# Patient Record
Sex: Female | Born: 1960 | Race: Black or African American | Hispanic: No | Marital: Married | Smoking: Current every day smoker
Health system: Southern US, Community
[De-identification: ages and names within clinical notes are randomized; demographics above are authoritative.]

---

## 2007-04-22 ENCOUNTER — Inpatient Hospital Stay (HOSPITAL_COMMUNITY): Admission: EM | Admit: 2007-04-22 | Discharge: 2007-04-27 | Payer: Self-pay | Admitting: Emergency Medicine

## 2007-04-26 ENCOUNTER — Ambulatory Visit: Payer: Self-pay | Admitting: Physical Medicine & Rehabilitation

## 2009-05-05 IMAGING — US US ABDOMEN COMPLETE
1 series · 14 of 25 positions shown · non-contrast
Comparison: None

CLINICAL DATA: Abdominal pain.  Acute pancreatitis.

ABDOMEN ULTRASOUND
TECHNIQUE: Complete abdominal ultrasound examination was performed
including evaluation of the liver, gallbladder, bile ducts,
pancreas, kidneys, spleen, IVC, and abdominal aorta.

[Series 1: unknown · 0.33mm/px · 14 of 58 slices shown]
[im 1/58]
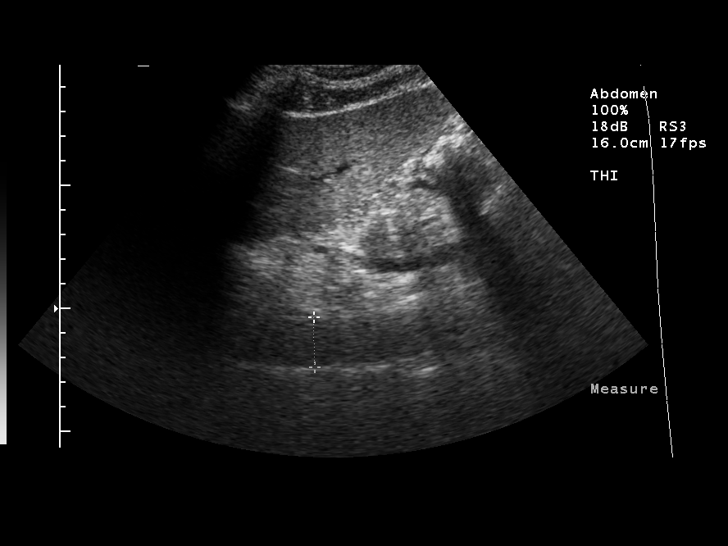
[im 5/58]
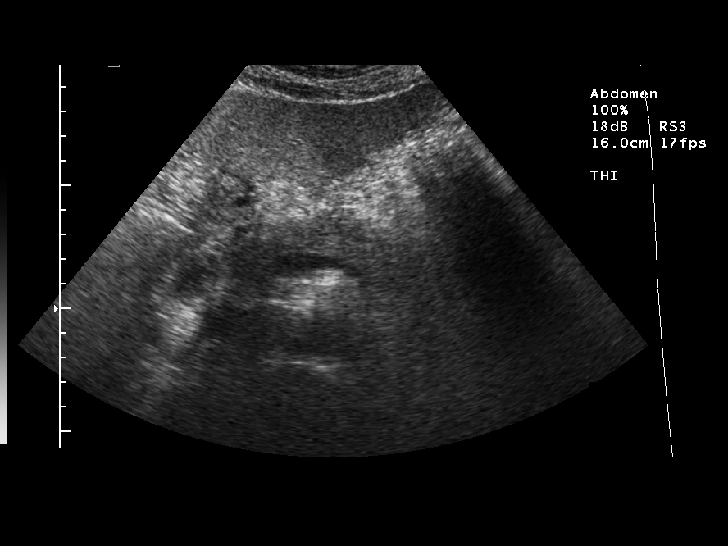
[im 10/58]
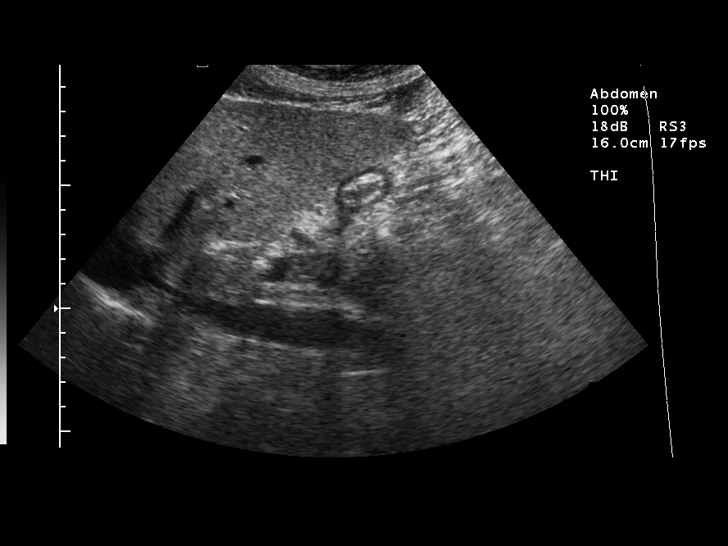
[im 15/58]
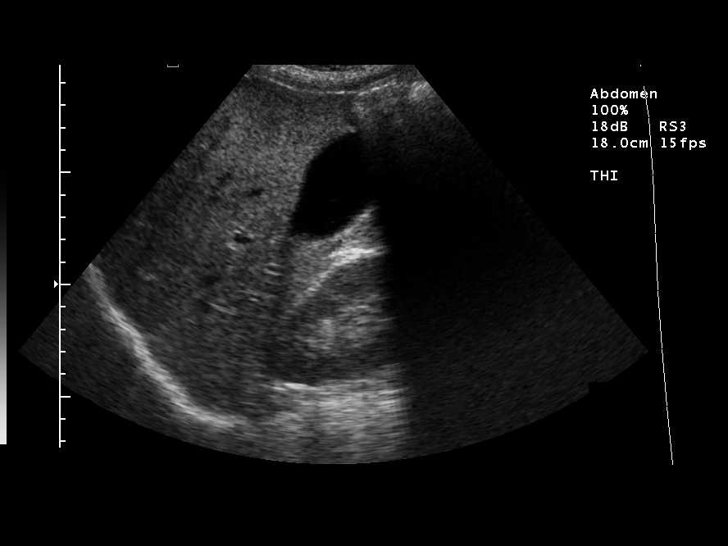
[im 20/58]
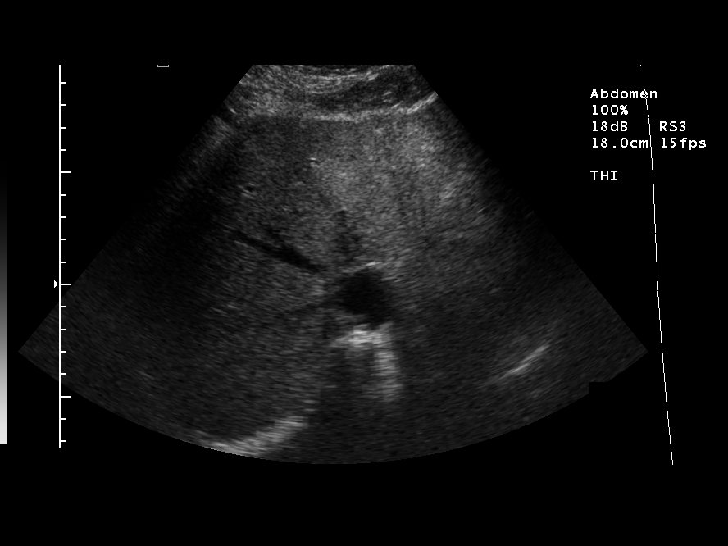
[im 22/58]
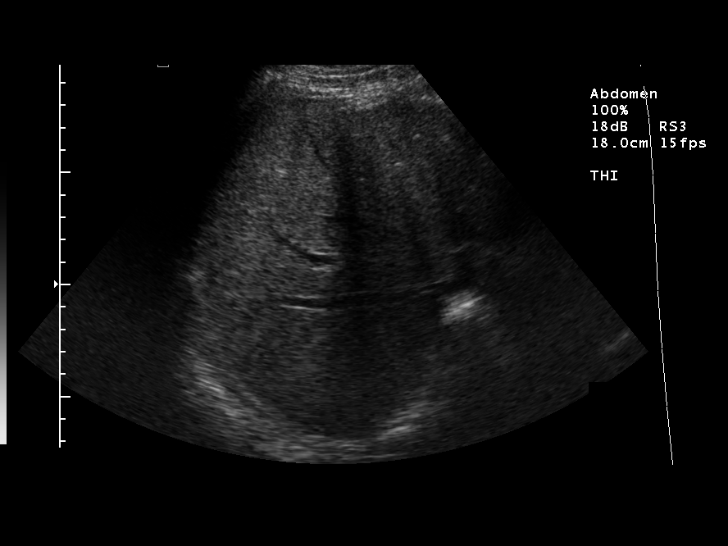
[im 27/58]
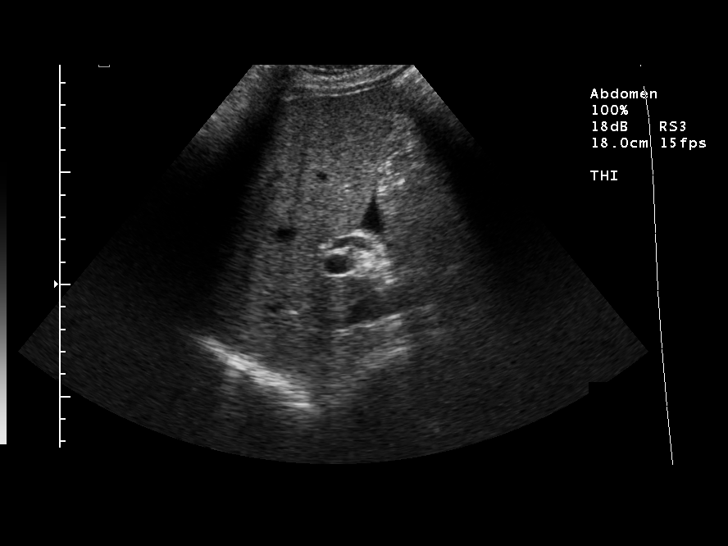
[im 31/58]
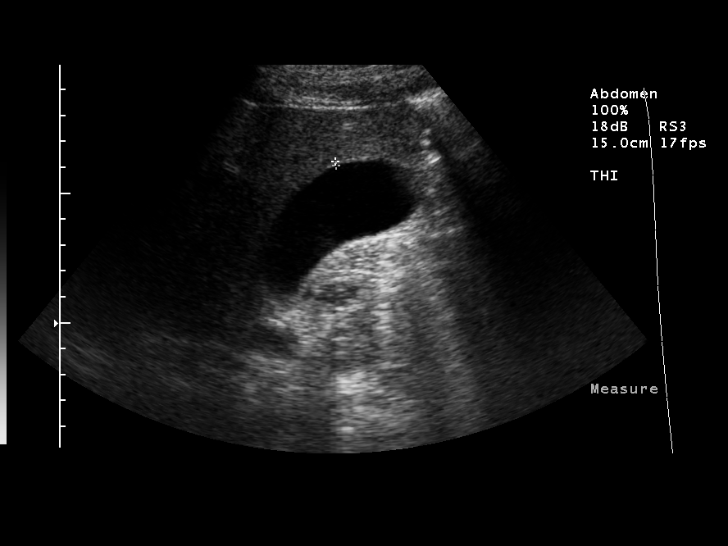
[im 36/58]
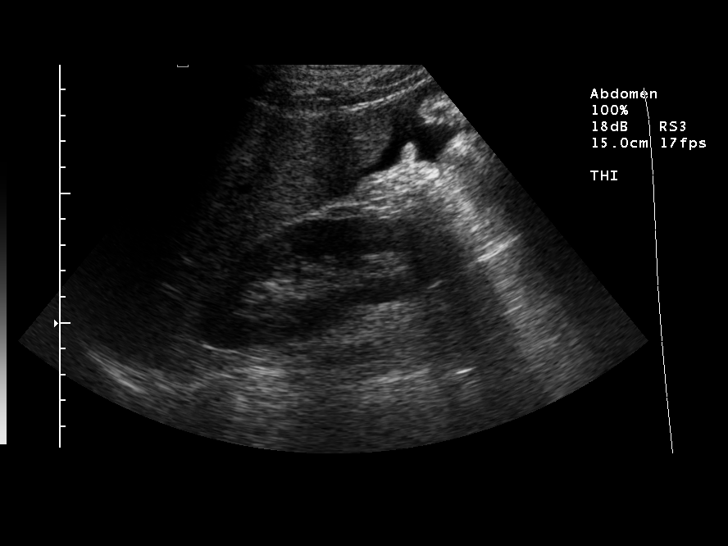
[im 39/58]
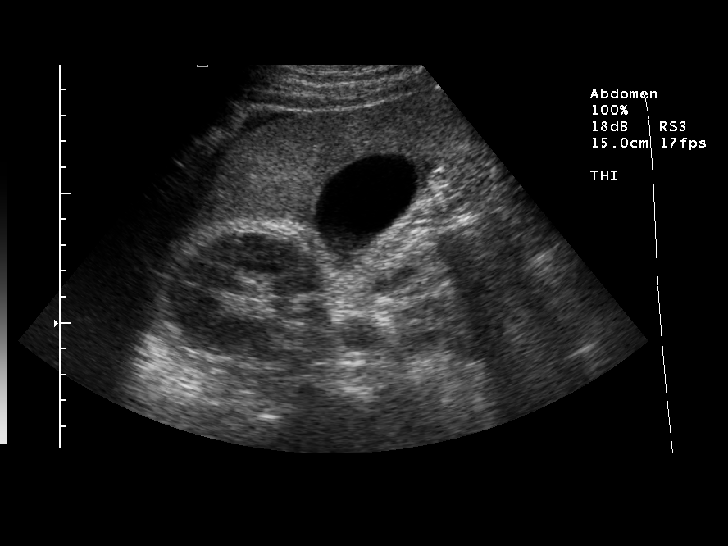
[im 43/58]
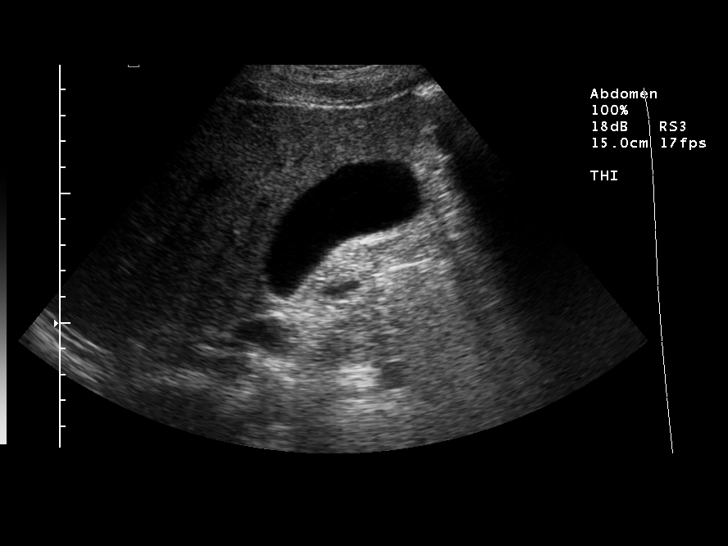
[im 48/58]
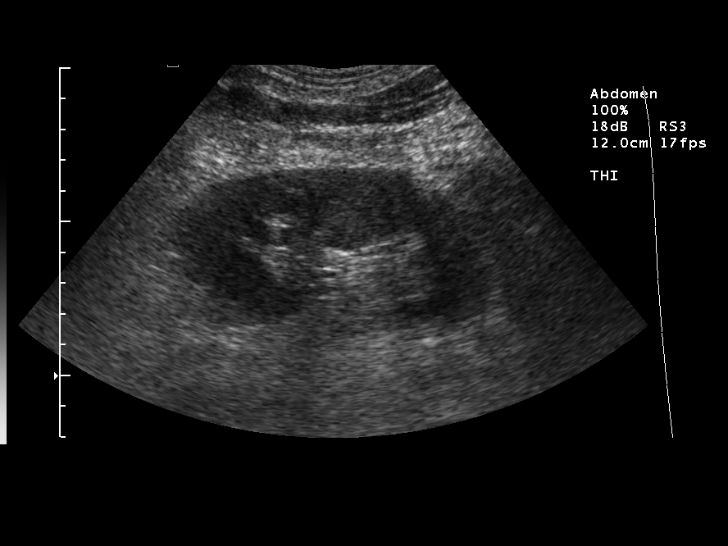
[im 53/58]
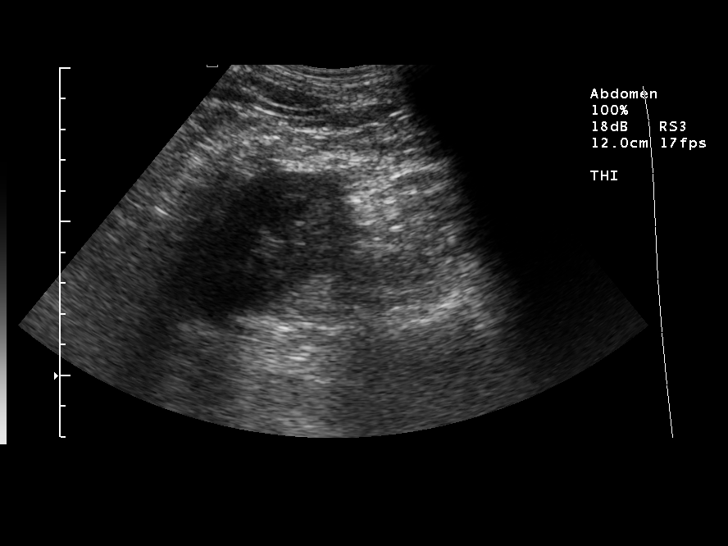
[im 58/58]
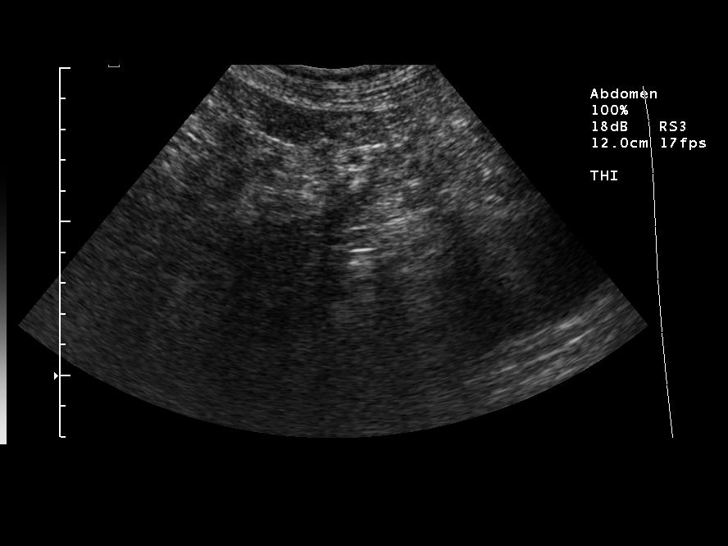

[14 of 25 positions shown; findings below may reference images not displayed]

FINDINGS: Normal gallbladder.  Negative Murphy's sign.  Gallbladder
wall thickness is 1.6 mm.  The common duct measures 5.5 mm which is
normal. The pancreas appears  hypoechoic which may be a result of
pancreatitis.  No hepatic , splenic, or renal abnormality.  The
spleen measures 6.9 cm in length.  The right and left kidneys
measure 10.8 cm and 10.6 cm in length, respectively.  Patent IVC.
Proximal abdominal aortic maximum diameter is 2.0 cm.  The mid to
distal abdominal aorta is not visualized.  Ascites is noted.
IMPRESSION: Normal gallbladder.  No biliary duct dilatation.  Ultrasonic
findings compatible acute pancreatitis. Negative for pseudocyst.
Ascites.

## 2009-05-06 IMAGING — CR DG CHEST 1V PORT
1 series · 1 of 1 positions shown · non-contrast
Comparison: None.

CLINICAL DATA: Pancreatitis.

PORTABLE CHEST - 1 VIEW

[view not recorded]
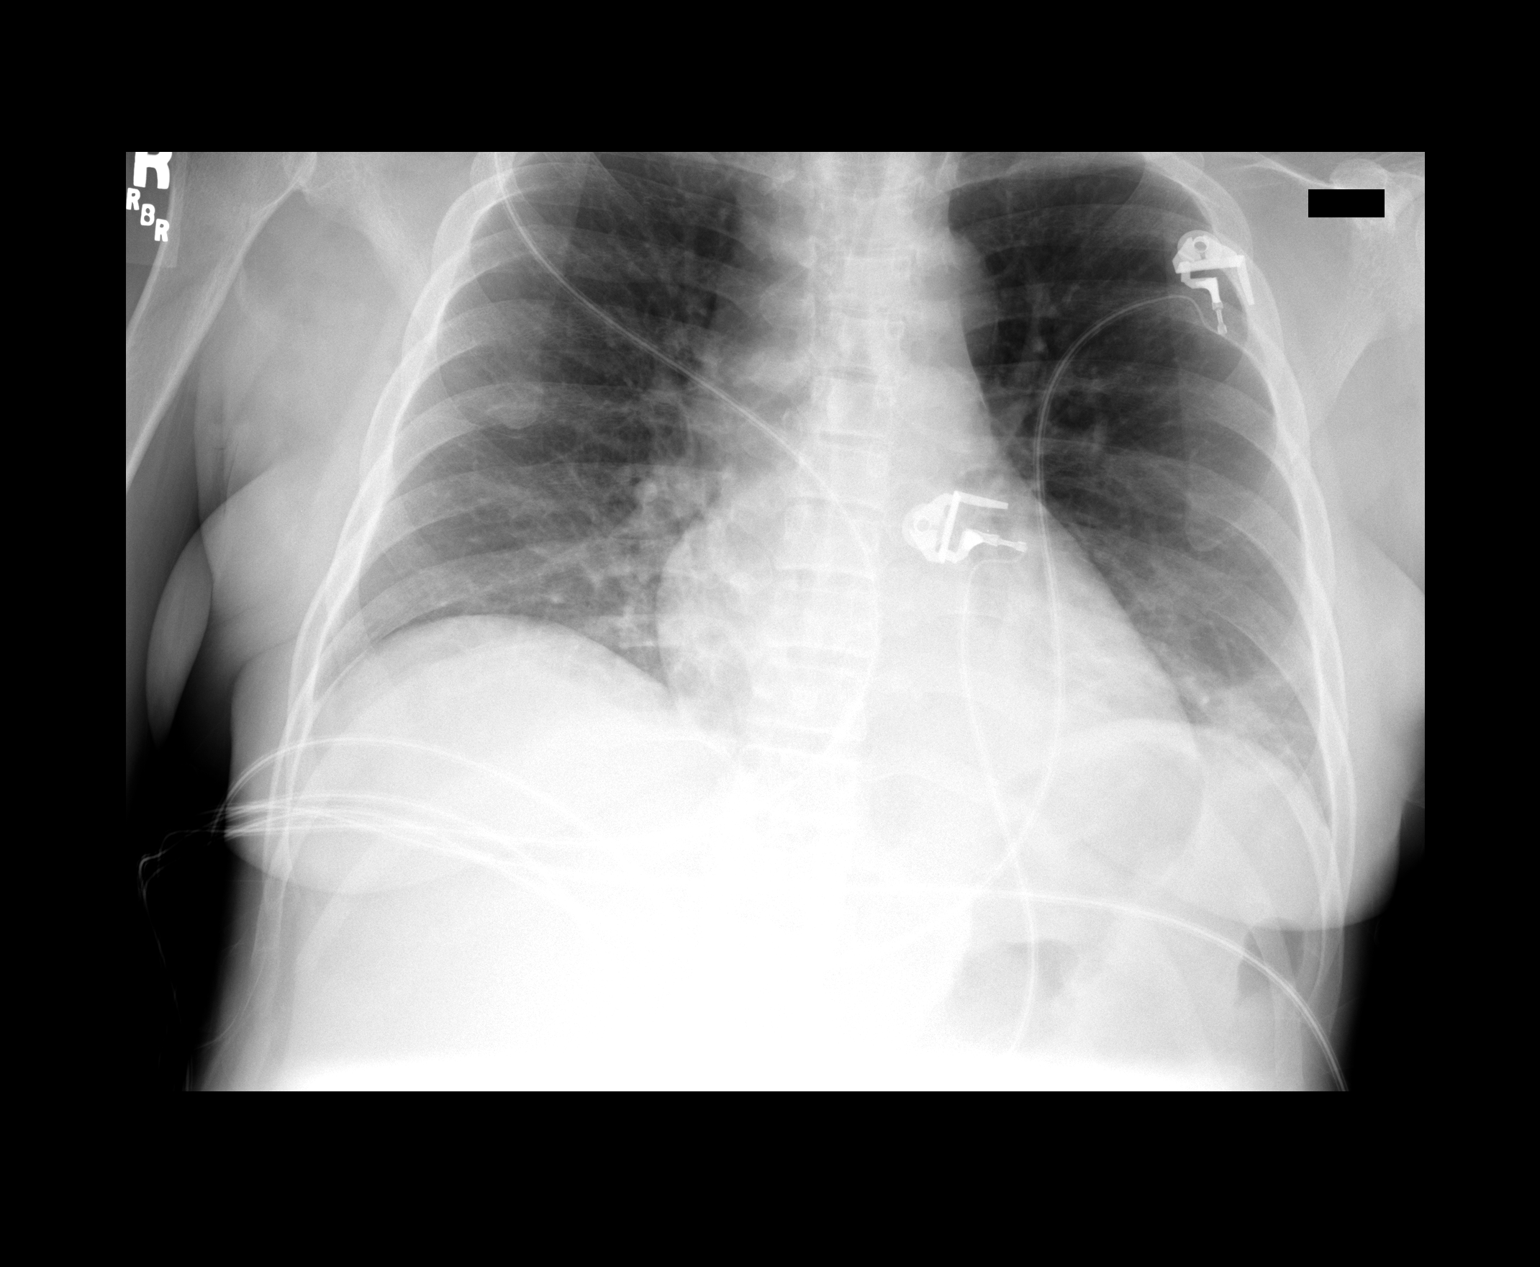

[1 of 1 positions shown; findings below may reference images not displayed]

FINDINGS: There is some mild left basilar atelectasis.  Lungs
otherwise clear.  No effusion.  Heart size normal.
IMPRESSION: Left basilar atelectasis.  No definite evidence of pneumonia.

## 2009-05-07 IMAGING — CT CT PELVIS W/ CM
2 of 5 series · 13 of 32 positions shown, 18 images · IV contrast (OMNI 300/WATER & 100 ML OMNI 300)
Comparison: None

CT ABDOMEN

CLINICAL DATA: Pancreatitis

CT ABDOMEN AND PELVIS WITH CONTRAST
TECHNIQUE: Multidetector CT imaging of the abdomen and pelvis was
performed using the standard protocol following bolus
administration of intravenous contrast.
Contrast: 100 ml Dmnipaque-JTT

[Series 2: routine abdomen · axial · 0.74mm/px · z∈[-359,-54]mm · 8 of 79 slices shown, 13 images]
[im 9/79  soft-tissue]
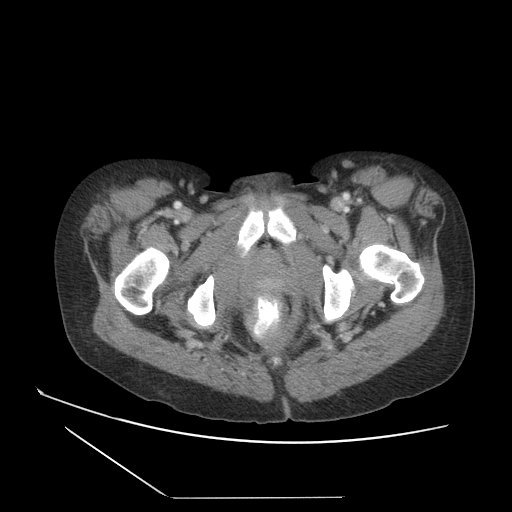
[im 9/79  bone]
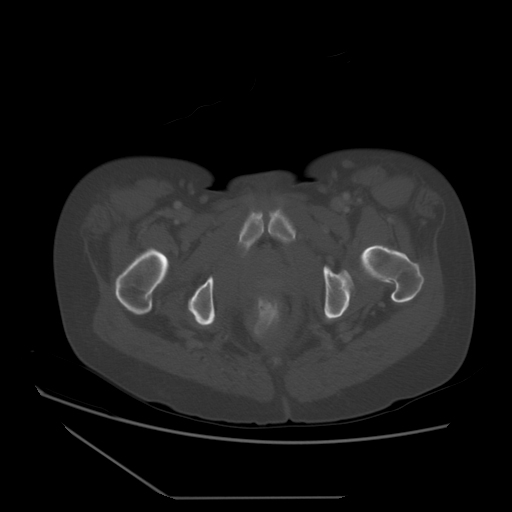
[im 18/79  soft-tissue]
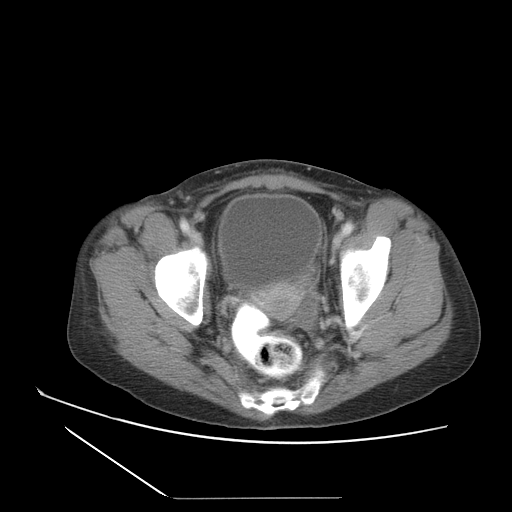
[im 27/79  soft-tissue]
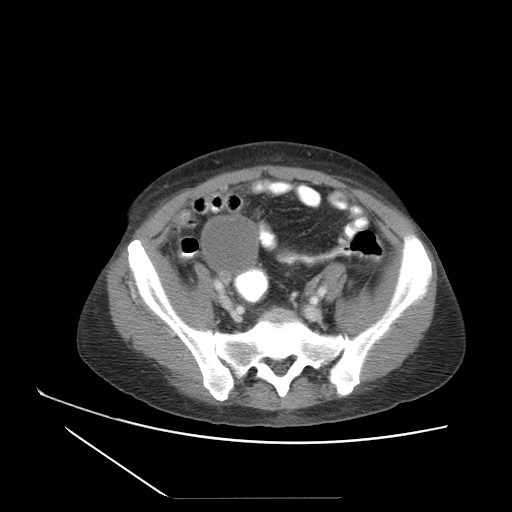
[im 35/79  soft-tissue]
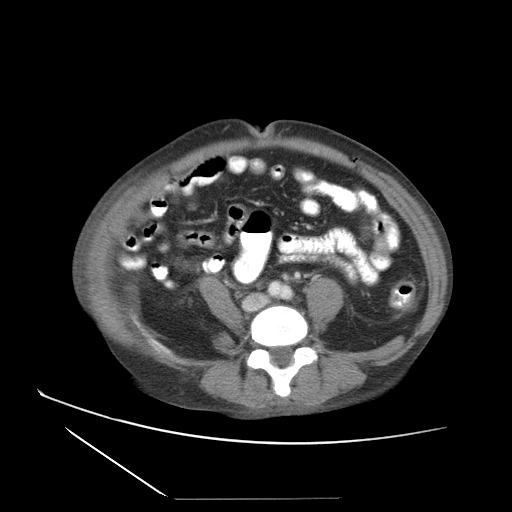
[im 44/79  soft-tissue]
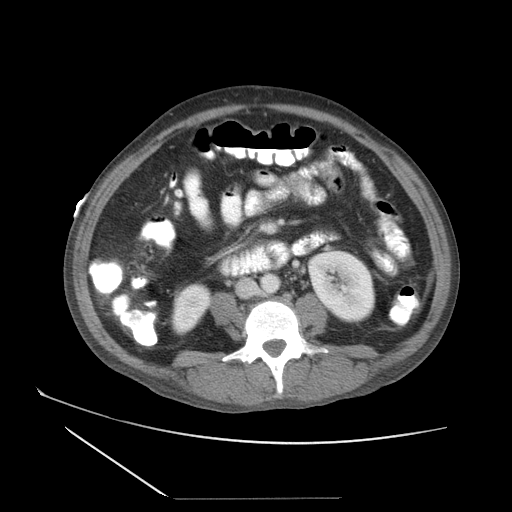
[im 44/79  lung]
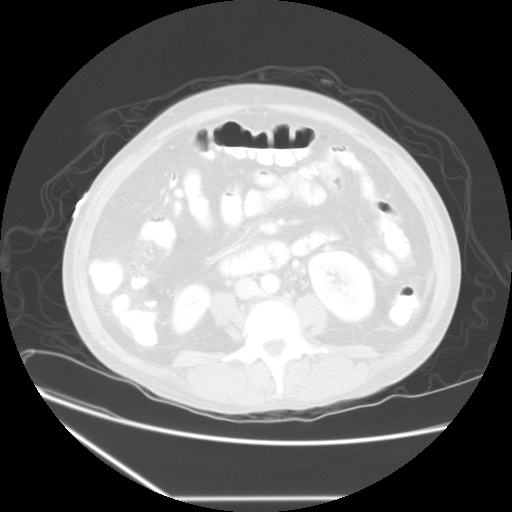
[im 53/79  soft-tissue]
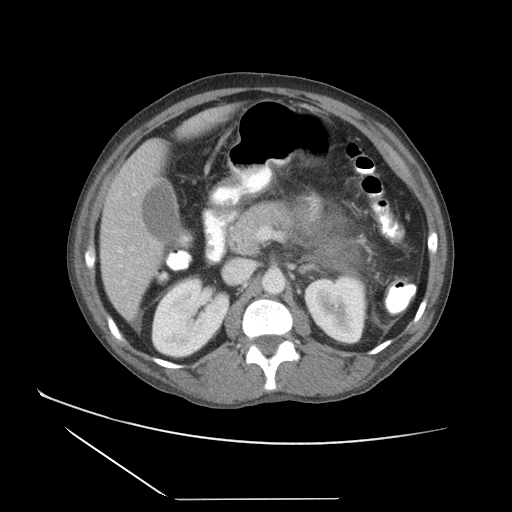
[im 53/79  lung]
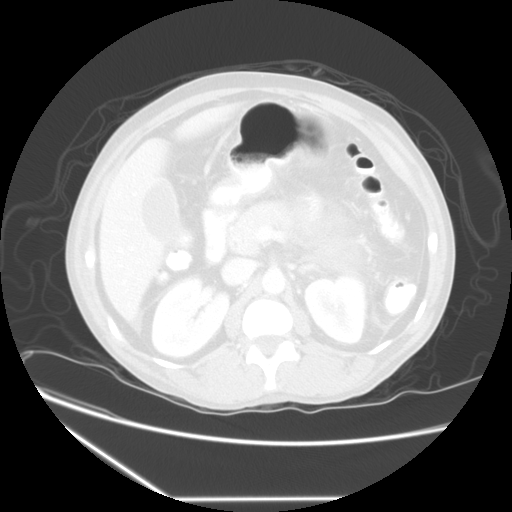
[im 61/79  soft-tissue]
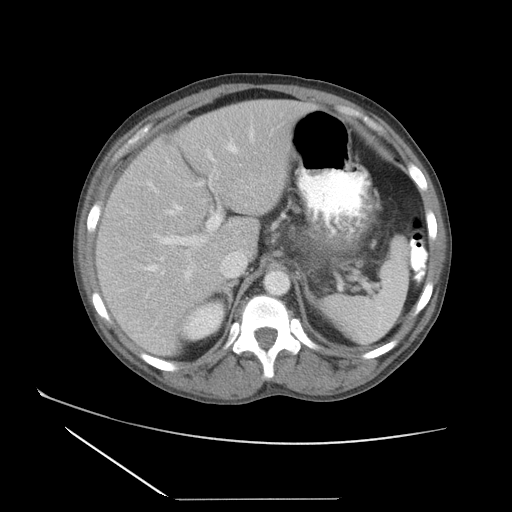
[im 61/79  lung]
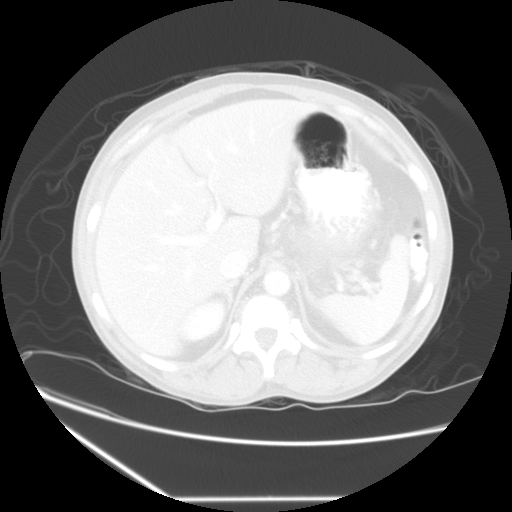
[im 70/79  soft-tissue]
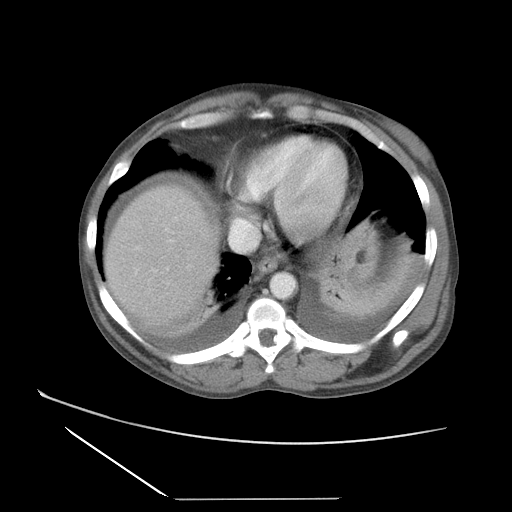
[im 70/79  lung]
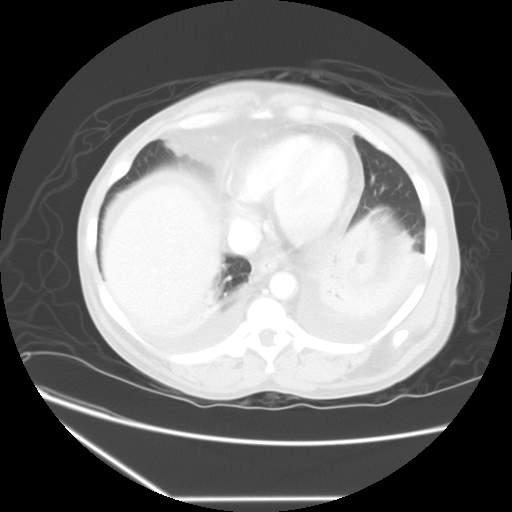

[Series 402: sag a/p · sagittal · 0.78mm/px · 5 of 78 slices shown]
[im 10/78  soft-tissue]
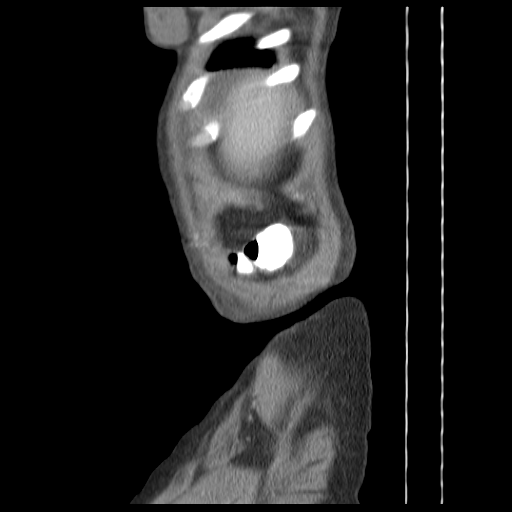
[im 20/78  soft-tissue]
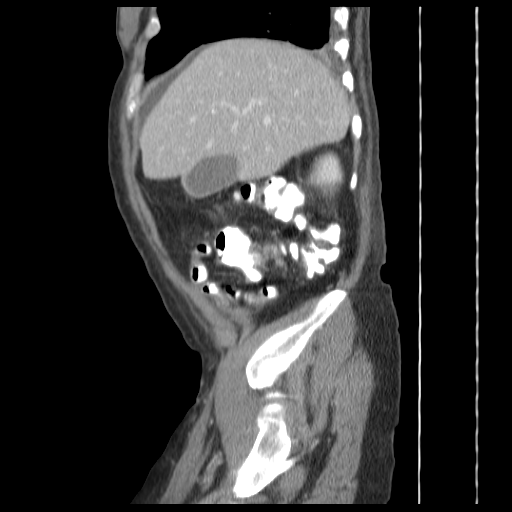
[im 29/78  soft-tissue]
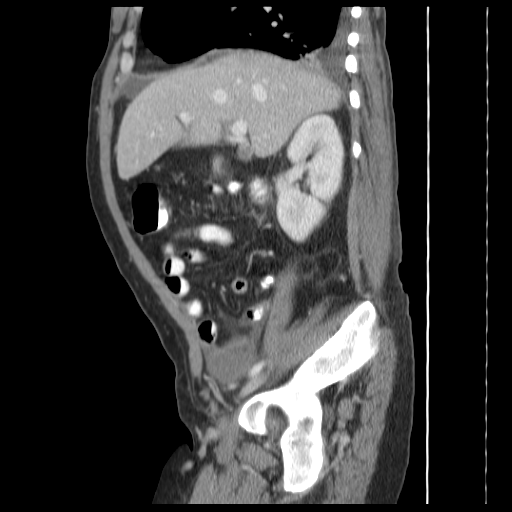
[im 39/78  soft-tissue]
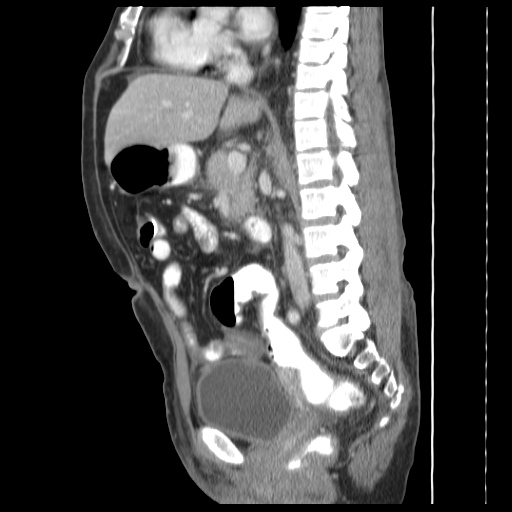
[im 49/78  soft-tissue]
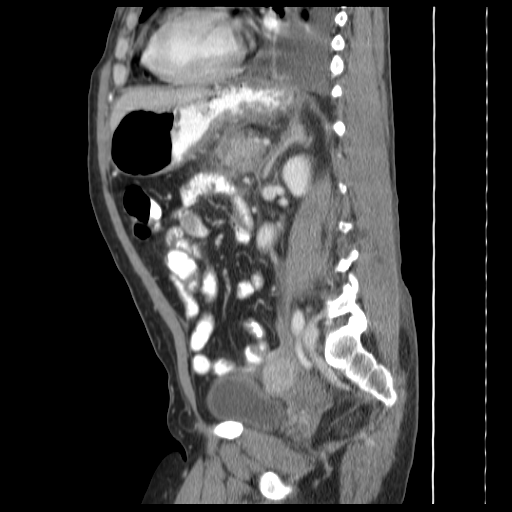

[13 of 32 positions shown; findings below may reference images not displayed]

FINDINGS: Small bilateral effusions and mild bibasilar atelectasis
is present.

Inflammatory change involving predominately the tail and body of
the pancreas is present.  There is stranding about this portion of
the pancreas in the adjacent retroperitoneal fat.  There is also
stranding and fluid density makes with soft tissue density region.
Portions of the pancreas in this area fails to enhance compatible
with minimal pancreatic necrosis.  I would estimate this to effect
approximately 10-15% of the entire gland.  There is no loculated
fluid collection containing gas to suggest abscess development.
There is no pancreatic hemorrhage.  No gallstones are seen.  No
pancreatic calcifications or ductal dilatation are present to
suggest chronic pancreatic changes.

The small amount of free fluid is seen about the liver and spleen.
The gallbladder, kidneys, adrenal glands are within normal limits.
IMPRESSION: Acute pancreatitis.  Minimal acute fluid collections involve the
tail and body.

Estimated to 10-15% pancreatic parenchymal necrosis.

No definite evidence of abscess.

Small amount of free fluid in the abdomen associated with the
inflammatory process.

Small bilateral pleural effusions.1

CT PELVIS
FINDINGS: A 4.1 x 5.6 by 5.6 cm cyst is present in the right
adnexa with a single peripheral calcification.  This is intimately
associated with the right ovary and is most likely a cystic lesion
of the right ovary.  There is a 2.6 cm lobulated mass attached to
the fundus of the uterus and left adnexa.  The left ovary is just
deep and inferior to this and is unremarkable. Trace free fluid is
seen in the pelvis.  Negative abnormal adenopathy.  The appendix is
normal.
IMPRESSION:

## 2010-06-11 NOTE — Discharge Summary (Signed)
NAMESAMARAH, Reed           ACCOUNT NO.:  [id number]   MEDICAL RECORD NO.:  [id number]          PATIENT TYPE:  INP   LOCATION:  5504                         FACILITY:  [facility]   PHYSICIAN:  Michaelyn Barter, M.D. DATE OF BIRTH:  [birth year]   DATE OF ADMISSION:  04/22/2007  DATE OF DISCHARGE:  04/27/2007                               DISCHARGE SUMMARY   PRIMARY CARE PHYSICIAN:  Unassigned.   FINAL DIAGNOSES:  1. Acute pancreatitis.  2. Pancreatic necrosis.  3. History of alcohol abuse.  4. Leukocytosis.  5. Mildly elevated bilirubin.  6. Mildly elevated triglycerides.   CONSULTATIONS:  General Surgery.   PROCEDURES:  1. Ultrasound of the abdomen completed on April 23, 2007.  2. Portable chest x-ray completed on April 24, 2007.  3. CT scan of the abdomen and pelvis completed on April 25, 2007.   HISTORY OF PRESENT ILLNESS:  [name] is a 46-year African American  female, who arrived with a chief complaint of abdominal pain,  accompanied by nausea and vomiting.   PAST MEDICAL HISTORY:  Please see that dictated by Dr. Marthann Schiller.   HOSPITAL COURSE:  1. Acute pancreatitis.  The patient's lipase was noted to be 854 at      that time of admission.  She was admitted to the medicine floor and      kept n.p.o.  She was provided with p.r.n. antiemetics.  An      ultrasound was completed of her abdomen, which revealed a normal      gallbladder.  No biliary duct dilatation.  Findings were compatible      with acute pancreatitis, negative for pseudocyst.  Ascites.  The      patient did openly admit to have an history of alcohol abuse and      this may have been associated with her acute pancreatitis.  The      patient's pain was quickly controlled with p.r.n. pain medications.      She, however, did have an impressive leukocytosis during her      hospital course.  At the time of admission, her white blood cell      count was 17.6; however, it began to climb up to a  high of 34.1.      IV Primaxin was started empirically.  A CT scan of the patient's      abdomen and pelvis was completed on March 29.  The CT scan of the      abdomen revealed acute pancreatitis.  Minimal acute fluid      collections involved the tail and body.  There was a 10%-15%      pancreatic parenchymal necrosis noted.  No definite evidence for      abscess.  By the 29, the patient's pain had almost completely      resolved.  General surgery was consulted.  Their note indicates      that at this time, the patient did not appear to be septic and her      pancreatitis appeared to be resolving.  There was no surgical      intervention  needed.  They simply recommended that IV antibiotics      continue for now.  By the date of discharge, the patient indicated      that her abdominal pain had completely resolved.  Her leukocytosis      had improved significantly.  The patient's lipase, last collected      on March 28, had declined to normal with a lipase of 55.  The      patient's overall clinical picture appeared to have improved.  2. History of alcohol abuse.  The patient was placed on Ativan IV      during the course of this hospitalization.  She did not demonstrate      any obvious active alcohol withdrawal.  3. Mildly elevated triglycerides.  A fasting lipid profile was done on      March 30.  The patient's LDL was 38, triglyceride level was noted      to be 168.  Consideration was made to starting Lipitor versus      TriCor; however, at this time neither was started.  These      parameters may need to be reviewed in the future an at that      particular time triglyceride-lowering agent may need to be started.   CONDITION AT THE TIME OF DISCHARGE:  By the date of discharge, the  patient had no abdominal pain.  She requested to be discharged home.  Her vitals on the date of discharge, her temperature was 98.7, heart  rate 79, respirations 19, blood pressure 155/80, and O2 sat 92% on  room  air.  She was discharged home on Creon 1 capsule p.o. t.i.d. as well as  Protonix 40 mg p.o. daily.  She was told to follow up with her primary  care doctor within 1-2 weeks and to stop drinking alcohol.      Michaelyn Barter, M.D.  Electronically Signed     OR/MEDQ  D:  04/27/2007  T:  04/28/2007  Job:  [id number]

## 2010-06-11 NOTE — Consult Note (Signed)
Alicia Reed, Alicia Reed           ACCOUNT NO.:  [id number]   MEDICAL RECORD NO.:  [id number]          PATIENT TYPE:  INP   LOCATION:  5504                         FACILITY:  [facility]   PHYSICIAN:  Alfonse Ras, MD   DATE OF BIRTH:  Jan 28, 1961   DATE OF CONSULTATION:  04/26/2007  DATE OF DISCHARGE:                                 CONSULTATION   TIME OF CONSULTATION:  1400 hours.   CONSULTING SURGEON:  Dr. Colin Benton.   REQUESTING PHYSICIAN:  Dr. Roxan Hockey with [facility].     The patient does not have a primary care physician.   REASON FOR CONSULTATION:  Pancreatic necrosis.   HISTORY OF PRESENT ILLNESS:  This is a 50 year old black female with a  history of health, alcohol abuse.  She presented to the ER on March 26  with a 1-day history of abdominal pain in the epigastric region and  emesis.  The patient states that her emesis is nonbilious and non  bloody.  She also says that she has not had any episodes of diarrhea or  constipation.  She also has noticed some associated back pain.  She is  also having trouble keeping any food down and every time she eats she  has these bouts of emesis.  She states the last time she drank was on  Tuesday prior to admission.  On admission the patient's white blood cell  count was 17,600 and is now down to 14,800.  The patient was started on  Primaxin several days ago.  Lipase on admission was 854 and on March 28  it has normalized to 55.  Her amylase is 115.  On admission the  patient's total bilirubin was slightly elevated at 1.3 and has been  stable since then.  Also after admission the patient had an ultrasound  of the abdomen which was basically normal except showing findings  consistent with acute pancreatitis.  After this the patient and have a  CT scan which showed again acute pancreatitis that was 10-15% pancreatic  parenchymal necrosis.  Because of this finding we were consulted for  possible surgical intervention.   REVIEW OF  SYSTEMS:  See HPI.  Otherwise all other systems are negative.   FAMILY HISTORY:  Noncontributory.   PAST MEDICAL HISTORY:  There is none.   PAST SURGICAL HISTORY:  There is none.   SOCIAL HISTORY:  The patient lives with her husband and states to me  that she only drinks about three beers a day.  However, in the history  and physical, apparently she stated that she drank approximately six  beers a day.  The patient also states that she smokes anywhere from one  to three cigarettes a day.   ALLERGIES:  PENICILLIN which gives her rash.   MEDICATIONS:  She is on no home medications.   PHYSICAL EXAM:  GENERAL:  This is a pleasant 50 year old black female  who is lying in bed in no acute distress.  VITAL SIGNS:  Temperature max is 99.4, pulse 83, respirations 20, blood  pressure 161/80.  EYES:  Sclerae noninjected.  Pupils equal, round and reactive to  light.  EARS, NOSE, MOUTH AND THROAT:  Ears and nose showed no obvious lesions  or masses.  No rhinorrhea.  Mouth is pink and moist.  Throat shows no  exudate.  Neck is supple.  Trachea is midline.  No thyromegaly.  Lungs were clear to auscultation bilaterally with no wheezes, rhonchi or  rales noted.  Respiratory effort is normal.  HEART:  Regular rate and rhythm.  Normal S1-S2 with no murmurs, gallops  or rubs noted.  +2 bilateral carotid and pedal pulses.  Chest is symmetrical.  Abdomen is soft nontender.  However, is distended and somewhat  tympanitic.  The patient does have active bowel sounds.  On palpation,  the patient does not show any signs of guarding.  MUSCULOSKELETAL:  All four extremities are symmetrical with no cyanosis,  clubbing or edema noted.  SKIN:  No obvious rashes, lesions or masses.  NEURO:  Cranial nerves II-XII are grossly intact.  Deep tendon reflex  exam is deferred at this time.  PSYCH:  The patient is alert and oriented x3 with appropriate affect.   LABS AND DIAGNOSTICS:  White blood cell count 14,800,  hemoglobin 9.79,  hematocrit 27.5, platelets 219,000.  Sodium 135, potassium 3.5, BUN 1,  creatinine 0.63.  LFTs on March 29 showed total bilirubin 1.4, alkaline  phosphatase 85, AST 21, ALT 13, and amylase and lipase that were drawn  on the 28th shows a lipase is 55 which has come down from 854 on  admission and amylase of 115. Diagnostics:  Abdominal ultrasound is  normal except for there is a finding consistent with acute pancreatitis.  No pseudocyst or ascites was found.  CT scan of the abdomen and pelvis  shows acute pancreatitis with an estimated 10-15% pancreatic parenchymal  necrosis.   IMPRESSION:  1. Acute pancreatitis.  2. Pancreatic parenchymal necrosis.   PLAN:  At this time we agree with the patient being treated with  Primaxin.  At this time the patient is not septic and is currently  resolving her pancreatitis.  At this time she no longer has any  abdominal pain and is tolerating clear liquids.  The patient states that  her doctor was planning on advancing her diet today as well.  Because of  these findings, at this time, there is no surgical intervention needed.  However, if the patient has become septic and febrile, then this may  become a surgical issue.  This patient has been discussed with Dr. Colin Benton  and any further recommendations will be per Dr. Colin Benton after her  evaluation of the patient.      Letha Cape, PA      Alfonse Ras, MD  Electronically Signed   KEO/MEDQ  D:  04/26/2007  T:  04/26/2007  Job:  [id number]   cc:   Dr. Roxan Hockey

## 2010-06-11 NOTE — H&P (Signed)
Alicia Reed, Alicia Reed           ACCOUNT NO.:  [id number]   MEDICAL RECORD NO.:  [id number]          PATIENT TYPE:  EMS   LOCATION:  [facility]                         FACILITY:  [facility]   PHYSICIAN:  Altha Harm, MDDATE OF BIRTH:  Mar 29, 1960   DATE OF ADMISSION:  04/22/2007  DATE OF DISCHARGE:                              HISTORY & PHYSICAL   CHIEF COMPLAINT:  Abdominal pain and vomiting x1 day.   HISTORY OF PRESENT ILLNESS:  This is a 50 year old African American  female who is an excessive beer drinker and presents to the emergency  room with abdominal pain and vomiting x1 day.  The patient states that  the pain is in the epigastric area, rates it as a 9/10, nonradiating and  aching in nature.  She said that she has associated vomiting associated  with the pain.  The patient states that the vomiting occurs with any  type of oral intake, is nonbilious, nonhematemetous, and she is unable  to count the amount of times she has vomited in the last 24 hours.  The  patient states she has never been hospitalized in the past and she has  no chronic illnesses.   PAST MEDICAL HISTORY:  As stated, is noncontributory except for tobacco  use disorder and excessive use.   FAMILY HISTORY:  The patient is unable to provide a family history for  me at this time.   SOCIAL HISTORY:  The patient lives with her husband.  She drinks  approximately six beers per day on a daily basis.  She smokes two to  three cigarettes per day for 20 years and she denies any illicit drug  use currently on in the past.  The patient states that her last drink  was on Tuesday.   ALLERGIES:  PENICILLIN WHICH CAUSES HER TO HAVE A RASH.   The patient is currently on no chronic medications.  She has no primary  care Lakelyn Straus.   REVIEW OF SYSTEMS:  Fourteen systems were reviewed.  All systems are  negative except for the HPI.   LABORATORY DATA:  Laboratory studies in the emergency room showed the  following:  White  blood cell count of 17.6, hemoglobin of 14.6,  hematocrit of 41.5, platelet count of 276, sodium of 135, potassium 3.1,  chloride 97, bicarb 28, BUN 4, creatinine 0.54, lipase of 854, bilirubin  mildly elevated at 1.3.  A urinalysis shows 3-6 WBCs; however, this is a  clean catch urine and it shows multiple squamous epithelial cells.   PHYSICAL EXAMINATION:  The patient is lying in bed, nontoxic appearing  and in no acute distress.  VITAL SIGNS:  Blood pressure 155/98, temperature 98.2, heart rate 93-  100, respiratory rate 16, pulse oximetry 99% on room air.  HEENT:  The patient is normocephalic, atraumatic.  Pupils are equally  round and reactive to light.  She is anicteric.  No conjunctivae pallor  noted.  Oropharynx is tacky.  No exudate, erythema or lesions are noted.  NECK:  Trachea is midline.  No masses, no thyromegaly, no JVD.  No  carotid bruit.  RESPIRATORY EXAM:  Patient has  a normal respiratory effort, equal  excursion bilaterally.  No wheezing or rhonchi noted.  ABDOMEN:  Abdomen is distended.  Patient has epigastric tenderness.  She  has hepatomegaly noted.  No splenomegaly noted.  She has normoactive  bowel sounds.  No guarding or rebound noted.  CARDIOVASCULAR:  She has mild tachycardia.  Normal S1, S2.  No murmurs,  rubs or gallops are noted.  PMI is nondisplaced.  No heaves or thrills  on palpation.  LYMPH NODE SURVEY:  No cervical, axillary, inguinal lymphadenopathy  noted.  MUSCULOSKELETAL:  No warmth, swelling or erythema around the joints.  No  spinal tenderness noted.  NEUROLOGICAL:  No focal neurological deficits.  No asterixis present.  Strength is 5/5 bilaterally upper and lower extremities.  Sensation is  intact to light touch, pin prick and proprioception.  Patient denies any  dysesthesias or paresthesias.  PSYCHIATRIC:  The patient is alert and oriented x3.  Good cognition.  Good recent to remote recall.   ASSESSMENT/PLAN:  This is a Patient who  presents with:  1. Acute pancreatitis.  2. Hypokalemia.  3. Leucocytosis without fever.  4. Alcohol dependence.  5. Risk of alcohol withdrawal.  6. Tobacco use disorder.  7. Precontemplative state.  8. Dehydration.   PLAN:  The patient will remain n.p.o.  She will be given aggressive IV  hydration and her potassium repleted via IV.  The patient will also be  given pain medication for her pancreatitis.  If the pain escalates, we  will go ahead and put her in a PCA.  For right now she will get p.r.n.  doses of Dilaudid.   I will also get an ultrasound of the right upper quadrant of the abdomen  and we will put the patient on a monitor and continue to monitor her  heart rate.  The patient is at risk for delirium tremens.  I will get an  alcohol level on her today and we will also go ahead and put the patient  on p.r.n. Ativan intimally.  The patient may need to go on an Ativan  protocol.  However, at this time the patient is not a candidate for  detox as she is not interested in alcohol cessation.  Thus, I would not  put the patient on a detox regimen.   Further treatment of the patient will be determined based upon initial  response to the treatment.  The patient has a leucocytosis.  However,  this may be secondary to the pain, inflammation and the dehydrated  state.  We will hydrate the patient and repeat the white blood cell  count in the a.m.  The patient has had a urinalysis performed.  However,  it was  clean catch and dirty urine, so I will repeat a cast specimen of  the UA test as for any evidence of cystitis, which may also contribute  to the leucocytosis.      Altha Harm, MD  Electronically Signed     MAM/MEDQ  D:  04/22/2007  T:  04/22/2007  Job:  [id number]

## 2010-10-21 LAB — URINE MICROSCOPIC-ADD ON

## 2010-10-21 LAB — COMPREHENSIVE METABOLIC PANEL
ALT: 8
AST: 20
Albumin: 2.3 — ABNORMAL LOW
Albumin: 2.5 — ABNORMAL LOW
Alkaline Phosphatase: 71
Alkaline Phosphatase: 85
BUN: 4 — ABNORMAL LOW
BUN: 4 — ABNORMAL LOW
CO2: 28
Calcium: 8.5
Chloride: 103
Chloride: 97
Chloride: 97
Creatinine, Ser: 0.61
Creatinine, Ser: 0.75
GFR calc Af Amer: >60
GFR calc non Af Amer: >60
Glucose, Bld: 146 — ABNORMAL HIGH
Glucose, Bld: 67 — ABNORMAL LOW
Potassium: 3.1 — ABNORMAL LOW
Sodium: 134 — ABNORMAL LOW
Total Bilirubin: 1.3 — ABNORMAL HIGH
Total Bilirubin: 1.4 — ABNORMAL HIGH
Total Bilirubin: 1.5 — ABNORMAL HIGH

## 2010-10-21 LAB — LIPID PANEL
Cholesterol: 103
HDL: 31 — ABNORMAL LOW
Total CHOL/HDL Ratio: 3.3
VLDL: 34

## 2010-10-21 LAB — BASIC METABOLIC PANEL
BUN: 3 — ABNORMAL LOW
Calcium: 8.3 — ABNORMAL LOW
Calcium: 8.6
Creatinine, Ser: 0.69
GFR calc Af Amer: >60
GFR calc non Af Amer: >60
GFR calc non Af Amer: >60
Glucose, Bld: 72
Glucose, Bld: 88
Potassium: 3.5
Potassium: 3.5
Sodium: 135

## 2010-10-21 LAB — URINALYSIS, ROUTINE W REFLEX MICROSCOPIC
Glucose, UA: 100 — AB
Ketones, ur: 40 — AB
Protein, ur: >300 — AB
Protein, ur: >300 — AB
Urobilinogen, UA: 1
pH: 6

## 2010-10-21 LAB — URINE CULTURE: Colony Count: NO GROWTH

## 2010-10-21 LAB — CBC
HCT: 27.1 — ABNORMAL LOW
HCT: 27.5 — ABNORMAL LOW
HCT: 30.8 — ABNORMAL LOW
HCT: 34.6 — ABNORMAL LOW
HCT: 41.5
Hemoglobin: 10.8 — ABNORMAL LOW
Hemoglobin: 14.6
Hemoglobin: 9.5 — ABNORMAL LOW
Hemoglobin: 9.7 — ABNORMAL LOW
MCHC: 35
MCV: 97.7
MCV: 97.8
MCV: 98.5
Platelets: 192
Platelets: 197
Platelets: 241
Platelets: 251
RBC: 2.77 — ABNORMAL LOW
RBC: 2.8 — ABNORMAL LOW
RBC: 3.27 — ABNORMAL LOW
RBC: 4.23
RDW: 12.6
RDW: 12.7
RDW: 12.8
RDW: 12.9
RDW: 13.2
WBC: 12.1 — ABNORMAL HIGH
WBC: 14.8 — ABNORMAL HIGH
WBC: 24.7 — ABNORMAL HIGH
WBC: 25.2 — ABNORMAL HIGH
WBC: 34.1 — ABNORMAL HIGH

## 2010-10-21 LAB — HEPATITIS B SURFACE ANTIGEN: Hepatitis B Surface Ag: NEGATIVE

## 2010-10-21 LAB — DIFFERENTIAL
Basophils Absolute: 0
Basophils Absolute: 0
Basophils Relative: 0
Eosinophils Absolute: 0
Eosinophils Relative: 0
Lymphocytes Relative: 4 — ABNORMAL LOW
Lymphs Abs: 1
Monocytes Relative: 6
Neutro Abs: 15.9 — ABNORMAL HIGH
Neutrophils Relative %: 90 — ABNORMAL HIGH
Neutrophils Relative %: 90 — ABNORMAL HIGH

## 2010-10-21 LAB — ETHANOL: Alcohol, Ethyl (B): <5

## 2010-10-21 LAB — CMV IGM: CMV IgM: <0.9 Index (ref <0.90)

## 2010-10-21 LAB — PHOSPHORUS: Phosphorus: 3.6

## 2010-10-21 LAB — LIPASE, BLOOD: Lipase: 328 — ABNORMAL HIGH

## 2010-10-21 LAB — MAGNESIUM: Magnesium: 2.1

## 2010-10-21 LAB — HEPATITIS A ANTIBODY, IGM: Hep A IgM: NEGATIVE

## 2013-08-23 ENCOUNTER — Encounter (HOSPITAL_COMMUNITY): Payer: Self-pay | Admitting: Emergency Medicine

## 2013-08-23 ENCOUNTER — Emergency Department (HOSPITAL_COMMUNITY)
Admission: EM | Admit: 2013-08-23 | Discharge: 2013-08-23 | Disposition: A | Discharge status: Home / Self Care | Payer: Self-pay | Attending: Emergency Medicine | Admitting: Emergency Medicine

## 2013-08-23 ENCOUNTER — Emergency Department (HOSPITAL_COMMUNITY): Payer: Self-pay

## 2013-08-23 DIAGNOSIS — F17200 Nicotine dependence, unspecified, uncomplicated: Secondary | ICD-10-CM | POA: Insufficient documentation

## 2013-08-23 DIAGNOSIS — R634 Abnormal weight loss: Secondary | ICD-10-CM | POA: Insufficient documentation

## 2013-08-23 DIAGNOSIS — R945 Abnormal results of liver function studies: Secondary | ICD-10-CM

## 2013-08-23 DIAGNOSIS — R7989 Other specified abnormal findings of blood chemistry: Secondary | ICD-10-CM | POA: Insufficient documentation

## 2013-08-23 DIAGNOSIS — Z79899 Other long term (current) drug therapy: Secondary | ICD-10-CM | POA: Insufficient documentation

## 2013-08-23 DIAGNOSIS — E876 Hypokalemia: Secondary | ICD-10-CM | POA: Insufficient documentation

## 2013-08-23 DIAGNOSIS — Z880 Allergy status to penicillin: Secondary | ICD-10-CM | POA: Insufficient documentation

## 2013-08-23 LAB — CBC WITH DIFFERENTIAL/PLATELET
BASOS ABS: 0 10*3/uL (ref 0.0–0.1)
Basophils Relative: 0 % (ref 0–1)
Eosinophils Absolute: 0 10*3/uL (ref 0.0–0.7)
Eosinophils Relative: 0 % (ref 0–5)
HEMATOCRIT: 31.2 % — AB (ref 36.0–46.0)
HEMOGLOBIN: 10.8 g/dL — AB (ref 12.0–15.0)
LYMPHS PCT: 23 % (ref 12–46)
Lymphs Abs: 2.7 10*3/uL (ref 0.7–4.0)
MCH: 33.4 pg (ref 26.0–34.0)
MCHC: 34.6 g/dL (ref 30.0–36.0)
MCV: 96.6 fL (ref 78.0–100.0)
MONO ABS: 1.1 10*3/uL — AB (ref 0.1–1.0)
MONOS PCT: 9 % (ref 3–12)
NEUTROS ABS: 7.9 10*3/uL — AB (ref 1.7–7.7)
Neutrophils Relative %: 68 % (ref 43–77)
Platelets: 339 10*3/uL (ref 150–400)
RBC: 3.23 MIL/uL — ABNORMAL LOW (ref 3.87–5.11)
RDW: 12 % (ref 11.5–15.5)
WBC: 11.7 10*3/uL — AB (ref 4.0–10.5)

## 2013-08-23 LAB — COMPREHENSIVE METABOLIC PANEL
ALK PHOS: 271 U/L — AB (ref 39–117)
ALT: 63 U/L — AB (ref 0–35)
AST: 70 U/L — AB (ref 0–37)
Albumin: 2.9 g/dL — ABNORMAL LOW (ref 3.5–5.2)
Anion gap: 16 — ABNORMAL HIGH (ref 5–15)
BILIRUBIN TOTAL: 0.5 mg/dL (ref 0.3–1.2)
BUN: 6 mg/dL (ref 6–23)
CHLORIDE: 99 meq/L (ref 96–112)
CO2: 25 meq/L (ref 19–32)
CREATININE: 0.52 mg/dL (ref 0.50–1.10)
Calcium: 8.6 mg/dL (ref 8.4–10.5)
GFR calc Af Amer: >90 mL/min (ref >90)
Glucose, Bld: 123 mg/dL — ABNORMAL HIGH (ref 70–99)
POTASSIUM: 3.2 meq/L — AB (ref 3.7–5.3)
Sodium: 140 mEq/L (ref 137–147)
Total Protein: 7.5 g/dL (ref 6.0–8.3)

## 2013-08-23 LAB — TSH: TSH: 1.25 u[IU]/mL (ref 0.350–4.500)

## 2013-08-23 LAB — LIPASE, BLOOD: LIPASE: 26 U/L (ref 11–59)

## 2013-08-23 LAB — T4, FREE: Free T4: 0.9 ng/dL (ref 0.80–1.80)

## 2013-08-23 MED ORDER — POTASSIUM CHLORIDE CRYS ER 20 MEQ PO TBCR
60.0000 meq | EXTENDED_RELEASE_TABLET | Freq: Once | ORAL | Status: AC
  Administered 2013-08-23: 60 meq via ORAL
  Filled 2013-08-23: qty 3

## 2013-08-23 NOTE — Discharge Instructions (Signed)
Failure to Thrive, Adult °Adult failure to thrive is a condition that some older people develop. People with this condition are able to do fewer and fewer activities over time. They may lose interest in being with friends or may not want to eat or drink. This is not a normal part of aging. Many things can cause this. Health problems, long-term disease, depression, bad eating habits, cognitive impairment, or disability may play a role in the development of this condition. Most of the time, it is important to treat whatever is causing failure to thrive. Sometimes, though, it might not be possible to treat the condition. The person could be nearing the end of life. Then, treatment could make the person suffer longer. °CAUSES °Sometimes, no specific cause can be found. Factors that have been linked to failure to thrive include: °· Diseases and medical conditions, such as: °¨ Cancer. °¨ Diabetes. °¨ Stomach and intestinal (gastrointestinal) problems. °¨ Lung disease. °¨ Liver disease. °¨ Kidney disease. °¨ Heart problems. °¨ Thyroid disease. °¨ Neurologic problems. °¨ Vitamin deficiencies. °· Disability. This may be the result of: °¨ A broken hip. °¨ A stroke. °¨ Very bad arthritis. °¨ Infections that last a long time. °¨ A long recovery from a surgery. °¨ Mental health issues. °¨ Medicines. °¨ Eating problems. °· Medicine for certain conditions. These conditions include: °¨ Parkinson's disease. °¨ Seizure disorder. °¨ Anxiety. °¨ Pain. °¨ High blood pressure. °¨ Depression. °¨ Infections. °SYMPTOMS °· Losing weight (more than 5% of total body weight). °· Getting more tired than usual after an activity. °· Having trouble getting up after sitting. °· Not being hungry or thirsty. °· Not getting out of bed. °· Not wanting to do usual activities. °· Being depressed. °· Getting infections often. °· Having bedsores. °· Taking a long time to recover after an injury or a surgery. °· Weakness. °DIAGNOSIS °A physical exam can help  a caregiver decide if someone has adult failure to thrive. This may include questions about the person's health presently and in the past. It also may include questions about behavior and mood, such as: °· Has activity changed? °· Does the person seem sad? °· Are eating habits different? °The caregiver may ask for a list of all medicines taken because certain medicines can lead to this condition. The list should include prescription and over-the-counter medicines. The caregiver will likely order some tests. These may include: °· Blood tests to check for infection, certain diseases, deficiencies, hormone levels, malnutrition, or dehydration. °· Urine tests to check for urinary tract infection or kidney failure. °· Imaging tests. Examples are an X-ray, a computed tomography (CT) scan, and magnetic resonance imaging (MRI). °· Hearing tests. °· Vision tests. °· Cognitive tests to check thinking ability. °· Activity tests to see if the person can do basic tasks like bathing and dressing. There also are tests to check if someone can shop, cook, or move around safely. °The caregiver will check if the person is eating enough healthy food. This may include: °· Checking weight. °· Having blood tested for cholesterol and protein levels. °· Seeing if anything else might be making it hard to eat (tooth problems, poorly fitted dentures, trouble swallowing). °The person may need to see a specialist to help with diagnosis or treatment. These specialists may include a speech therapist, physical therapist, occupational therapist, dietitian, or social worker.  °TREATMENT °Treatment for adult failure to thrive depends on the cause. Caregivers also must decide if a treatment has a good chance of working. It   often takes a team of caregivers to find the right treatment. Options may include: °· Treatments to cure a disease that can cause adult failure to thrive. °· Talk therapy or medicine to treat depression. °· A better diet. Eating more  often, adding nutritional supplements between meals, or taking vitamins may be suggested. Sometimes, medicine is prescribed to boost appetite. °· Medicine changes or stopping a medicine. °· Physical therapy. °· Moving to a place that offers more aid. °HOME CARE INSTRUCTIONS °What needs to be done at home varies from person to person. This will depend on what caused the condition and how it is treated. However, basic guidelines include: °· Taking any medicine prescribed by the caregiver. Following the directions carefully is important. °· Eating healthy foods. There should be enough calories in each meal. Ask the caregiver if vitamins or nutritional supplements should be taken between meals. Consider talking with a dietitian. °· Exercising. Strength training is important. A physical therapist can help set up an exercise program that fits the person. °· Making sure the person is safe at home. °· Talking with caregivers about what should be done if the person can no longer make decisions for himself or herself. °SEEK MEDICAL CARE IF: °· There are any questions about medicines. °· There are questions about the effects of treatment. °· The person is not able to eat well. °· The person is not able to move around. °· The person feels very sad or hopeless. °SEEK IMMEDIATE MEDICAL CARE IF:  °· The person has thoughts of ending his or her life. °· The person cannot eat or drink. °· The person does not get out of bed. °· Staying at home is no longer safe. °· The person has a fever. °Document Released: 04/07/2011 Document Reviewed: 04/07/2011 °[employer]® Patient Information ©2015 [employer]. This information is not intended to replace advice given to you by your health care provider. Make sure you discuss any questions you have with your health care provider. ° °

## 2013-08-23 NOTE — ED Provider Notes (Signed)
CSN: [id number]     Arrival date & time 08/23/13  1502 History   First MD Initiated Contact with Patient 08/23/13 1914     Chief Complaint  Patient presents with  . Weight Loss     (Consider location/radiation/quality/duration/timing/severity/associated sxs/prior Treatment) HPI Comments: Pt presents with about 60 lbs weight loss over last several months to 1 year. She denies fevers, night sweats, bleeding, CP, SOB, abdominal pain, enlarged lymph nodes. She drinks beer daily. Denies drug use. No known medical history.   The history is provided by the patient. No language interpreter was used.    History reviewed. No pertinent past medical history. History reviewed. No pertinent past surgical history. History reviewed. No pertinent family history. History  Substance Use Topics  . Smoking status: Current Every Day Smoker    Types: Cigarettes  . Smokeless tobacco: Not on file  . Alcohol Use: Yes     Comment: occ   OB History   Grav Para Term Preterm Abortions TAB SAB Ect Mult Living                 Review of Systems  Constitutional: Positive for unexpected weight change. Negative for fever, chills, diaphoresis, activity change, appetite change and fatigue.  HENT: Negative for congestion, facial swelling, rhinorrhea and sore throat.   Eyes: Negative for photophobia and discharge.  Respiratory: Negative for cough, chest tightness and shortness of breath.   Cardiovascular: Negative for chest pain, palpitations and leg swelling.  Gastrointestinal: Negative for nausea, vomiting, abdominal pain and diarrhea.  Endocrine: Negative for polydipsia and polyuria.  Genitourinary: Negative for dysuria, frequency, difficulty urinating and pelvic pain.  Musculoskeletal: Negative for arthralgias, back pain, neck pain and neck stiffness.  Skin: Negative for color change and wound.  Allergic/Immunologic: Negative for immunocompromised state.  Neurological: Negative for facial asymmetry, weakness,  numbness and headaches.  Hematological: Does not bruise/bleed easily.  Psychiatric/Behavioral: Negative for confusion and agitation.      Allergies  Penicillins  Home Medications   Prior to Admission medications   Medication Sig Start Date End Date Taking? Authorizing Provider  omeprazole (PRILOSEC OTC) 20 MG tablet Take 20 mg by mouth daily.   Yes Historical Provider, MD   BP 143/71  Pulse 68  Temp(Src) 98.2 F (36.8 C) (Oral)  Resp 15  Ht 4\' 11"  (1.499 m)  Wt 59 lb 9.6 oz (27.034 kg)  BMI 12.03 kg/m2  SpO2 100% Physical Exam  Constitutional: She is oriented to person, place, and time. She appears well-developed and well-nourished. She appears cachectic. No distress.  HENT:  Head: Normocephalic and atraumatic.  Mouth/Throat: No oropharyngeal exudate.  Eyes: Pupils are equal, round, and reactive to light.  Neck: Normal range of motion. Neck supple.  Cardiovascular: Normal rate, regular rhythm and normal heart sounds.  Exam reveals no gallop and no friction rub.   No murmur heard. Pulmonary/Chest: Effort normal and breath sounds normal. No respiratory distress. She has no wheezes. She has no rales.  Abdominal: Soft. Bowel sounds are normal. She exhibits no distension and no mass. There is no tenderness. There is no rebound and no guarding.  Musculoskeletal: Normal range of motion. She exhibits no edema and no tenderness.  Neurological: She is alert and oriented to person, place, and time.  Skin: Skin is warm and dry.  Psychiatric: She has a normal mood and affect.    ED Course  Procedures (including critical care time) Labs Review Labs Reviewed  CBC WITH DIFFERENTIAL - Abnormal; Notable for  the following:    WBC 11.7 (*)    RBC 3.23 (*)    Hemoglobin 10.8 (*)    HCT 31.2 (*)    Neutro Abs 7.9 (*)    Monocytes Absolute 1.1 (*)    All other components within normal limits  COMPREHENSIVE METABOLIC PANEL - Abnormal; Notable for the following:    Potassium 3.2 (*)     Glucose, Bld 123 (*)    Albumin 2.9 (*)    AST 70 (*)    ALT 63 (*)    Alkaline Phosphatase 271 (*)    Anion gap 16 (*)    All other components within normal limits  LIPASE, BLOOD  TSH  T4, FREE  HEPATITIS PANEL, ACUTE    Imaging Review Dg Chest 2 View  08/23/2013   CLINICAL DATA:  Weight loss.  Smoker.  EXAM: CHEST  2 VIEW  COMPARISON:  04/24/2007  FINDINGS: Heart size and pulmonary vascularity are normal. Lungs are clear. The patient appears emaciated.  IMPRESSION: No active cardiopulmonary disease.   Electronically Signed   By: Geanie Cooley M.D.   On: 08/23/2013 20:36     EKG Interpretation None      MDM   Final diagnoses:  Weight loss  Hypokalemia  LFT elevation    Pt is a 53 y.o. female with Pmhx as above who presents with unintentional weight loss over the past several months (more likely year).  Denies fever, night sweats, ab pain, chest pain, SOB, enlarged lymph nodes, blood in stool, urine, easy bruising or bleeding. She has long hx of tobacco use.  Labs show mild LFT elevations (likely due to chronic ETOH use), mild hypokalemia.  Hepatitis panel and thyroid studies will be sent. Screening CXR nml.  Suspect undiagnosed malignancy given her severe appearing cachexia which I discussed with the pt and family.  Have offered to get screening CT chest, AB/pelvis in dept as she does not have established PCP, she would prefer to continue w/u as outpt.  Have given resources as well as reccommendation to establish with [facility].  Return precautions given for new or worsening symptoms including onset of pain, fever, SOB, bleeding.          Shanna Cisco, MD 08/25/13 [phone]

## 2013-08-23 NOTE — ED Notes (Signed)
Pt reports loosing large amount of weight over the past year, weight is 59.6lbs at triage. Pt reports eating, denies lose of appetite or n/v/d.

## 2013-08-23 NOTE — ED Notes (Signed)
Pt denies any new complaints. States she just decided to come to see why she is losing weight.

## 2013-08-24 LAB — HEPATITIS PANEL, ACUTE
HCV AB: NEGATIVE
HEP A IGM: NONREACTIVE
HEP B C IGM: NONREACTIVE
Hepatitis B Surface Ag: NEGATIVE

## 2013-09-20 ENCOUNTER — Ambulatory Visit: Payer: Self-pay | Attending: Internal Medicine

## 2014-03-30 ENCOUNTER — Emergency Department (HOSPITAL_COMMUNITY): Payer: Self-pay

## 2014-03-30 ENCOUNTER — Encounter (HOSPITAL_COMMUNITY): Payer: Self-pay | Admitting: *Deleted

## 2014-03-30 ENCOUNTER — Inpatient Hospital Stay (HOSPITAL_COMMUNITY)
Admission: EM | Admit: 2014-03-30 | Discharge: 2014-03-31 | DRG: 193 | Discharge status: Against Medical Advice | Payer: Self-pay | Attending: Family Medicine | Admitting: Family Medicine

## 2014-03-30 DIAGNOSIS — R197 Diarrhea, unspecified: Secondary | ICD-10-CM

## 2014-03-30 DIAGNOSIS — R911 Solitary pulmonary nodule: Secondary | ICD-10-CM

## 2014-03-30 DIAGNOSIS — Z681 Body mass index (BMI) 19 or less, adult: Secondary | ICD-10-CM

## 2014-03-30 DIAGNOSIS — K861 Other chronic pancreatitis: Secondary | ICD-10-CM | POA: Diagnosis present

## 2014-03-30 DIAGNOSIS — R059 Cough, unspecified: Secondary | ICD-10-CM

## 2014-03-30 DIAGNOSIS — D649 Anemia, unspecified: Secondary | ICD-10-CM | POA: Diagnosis present

## 2014-03-30 DIAGNOSIS — F17210 Nicotine dependence, cigarettes, uncomplicated: Secondary | ICD-10-CM | POA: Diagnosis present

## 2014-03-30 DIAGNOSIS — Z79899 Other long term (current) drug therapy: Secondary | ICD-10-CM

## 2014-03-30 DIAGNOSIS — E876 Hypokalemia: Secondary | ICD-10-CM | POA: Diagnosis present

## 2014-03-30 DIAGNOSIS — R64 Cachexia: Secondary | ICD-10-CM | POA: Diagnosis present

## 2014-03-30 DIAGNOSIS — R05 Cough: Secondary | ICD-10-CM

## 2014-03-30 DIAGNOSIS — E43 Unspecified severe protein-calorie malnutrition: Secondary | ICD-10-CM | POA: Diagnosis present

## 2014-03-30 DIAGNOSIS — F1010 Alcohol abuse, uncomplicated: Secondary | ICD-10-CM | POA: Diagnosis present

## 2014-03-30 DIAGNOSIS — J189 Pneumonia, unspecified organism: Principal | ICD-10-CM | POA: Diagnosis present

## 2014-03-30 LAB — CBC WITH DIFFERENTIAL/PLATELET
BASOS ABS: 0 10*3/uL (ref 0.0–0.1)
BASOS PCT: 0 % (ref 0–1)
Eosinophils Absolute: 0 10*3/uL (ref 0.0–0.7)
Eosinophils Relative: 0 % (ref 0–5)
HCT: 27.1 % — ABNORMAL LOW (ref 36.0–46.0)
HEMOGLOBIN: 9.6 g/dL — AB (ref 12.0–15.0)
LYMPHS PCT: 21 % (ref 12–46)
Lymphs Abs: 1.8 10*3/uL (ref 0.7–4.0)
MCH: 32.7 pg (ref 26.0–34.0)
MCHC: 35.4 g/dL (ref 30.0–36.0)
MCV: 92.2 fL (ref 78.0–100.0)
Monocytes Absolute: 0.5 10*3/uL (ref 0.1–1.0)
Monocytes Relative: 5 % (ref 3–12)
NEUTROS ABS: 6.3 10*3/uL (ref 1.7–7.7)
NEUTROS PCT: 74 % (ref 43–77)
Platelets: 250 10*3/uL (ref 150–400)
RBC: 2.94 MIL/uL — ABNORMAL LOW (ref 3.87–5.11)
RDW: 13.7 % (ref 11.5–15.5)
WBC: 8.7 10*3/uL (ref 4.0–10.5)

## 2014-03-30 LAB — COMPREHENSIVE METABOLIC PANEL
ALT: 17 U/L (ref 0–35)
AST: 54 U/L — ABNORMAL HIGH (ref 0–37)
Albumin: 1.3 g/dL — ABNORMAL LOW (ref 3.5–5.2)
Alkaline Phosphatase: 256 U/L — ABNORMAL HIGH (ref 39–117)
Anion gap: 11 (ref 5–15)
BILIRUBIN TOTAL: 0.6 mg/dL (ref 0.3–1.2)
CHLORIDE: 96 mmol/L (ref 96–112)
CO2: 26 mmol/L (ref 19–32)
CREATININE: 0.42 mg/dL — AB (ref 0.50–1.10)
Calcium: 7.1 mg/dL — ABNORMAL LOW (ref 8.4–10.5)
GLUCOSE: 188 mg/dL — AB (ref 70–99)
POTASSIUM: 2.6 mmol/L — AB (ref 3.5–5.1)
Sodium: 133 mmol/L — ABNORMAL LOW (ref 135–145)
Total Protein: 4.9 g/dL — ABNORMAL LOW (ref 6.0–8.3)

## 2014-03-30 LAB — LIPASE, BLOOD: Lipase: 13 U/L (ref 11–59)

## 2014-03-30 LAB — TROPONIN I

## 2014-03-30 LAB — MAGNESIUM: Magnesium: 1.8 mg/dL (ref 1.5–2.5)

## 2014-03-30 MED ORDER — CEFTRIAXONE SODIUM 1 G IJ SOLR: 1.0000 g | INTRAMUSCULAR | Status: DC

## 2014-03-30 MED ORDER — FOLIC ACID 1 MG PO TABS
1.0000 mg | ORAL_TABLET | Freq: Every day | ORAL | Status: DC
  Filled 2014-03-30: qty 1

## 2014-03-30 MED ORDER — POTASSIUM CHLORIDE IN NACL 40-0.9 MEQ/L-% IV SOLN
INTRAVENOUS | Status: DC
Start: 2014-03-31 — End: 2014-03-31
  Administered 2014-03-31: 100 mL/h via INTRAVENOUS
  Filled 2014-03-30 (×2): qty 1000

## 2014-03-30 MED ORDER — METRONIDAZOLE IN NACL 5-0.79 MG/ML-% IV SOLN
500.0000 mg | Freq: Three times a day (TID) | INTRAVENOUS | Status: DC
  Administered 2014-03-31: 500 mg via INTRAVENOUS
  Filled 2014-03-30 (×3): qty 100

## 2014-03-30 MED ORDER — LEVOFLOXACIN IN D5W 750 MG/150ML IV SOLN: 750.0000 mg | INTRAVENOUS | Status: DC

## 2014-03-30 MED ORDER — DEXTROSE 5 % IV SOLN
1.0000 g | Freq: Once | INTRAVENOUS | Status: AC
  Administered 2014-03-30: 1 g via INTRAVENOUS
  Filled 2014-03-30: qty 10

## 2014-03-30 MED ORDER — IOHEXOL 300 MG/ML  SOLN
25.0000 mL | INTRAMUSCULAR | Status: AC
  Administered 2014-03-30: 25 mL via ORAL

## 2014-03-30 MED ORDER — ONDANSETRON HCL 4 MG/2ML IJ SOLN
4.0000 mg | Freq: Once | INTRAMUSCULAR | Status: AC
  Administered 2014-03-30: 4 mg via INTRAVENOUS
  Filled 2014-03-30: qty 2

## 2014-03-30 MED ORDER — VITAMIN B-1 100 MG PO TABS
100.0000 mg | ORAL_TABLET | Freq: Every day | ORAL | Status: DC
  Filled 2014-03-30: qty 1

## 2014-03-30 MED ORDER — LORAZEPAM 2 MG/ML IJ SOLN: 1.0000 mg | Freq: Four times a day (QID) | INTRAMUSCULAR | Status: DC | PRN

## 2014-03-30 MED ORDER — SODIUM CHLORIDE 0.9 % IV BOLUS (SEPSIS)
1000.0000 mL | Freq: Once | INTRAVENOUS | Status: AC
  Administered 2014-03-30: 1000 mL via INTRAVENOUS

## 2014-03-30 MED ORDER — ADULT MULTIVITAMIN W/MINERALS CH
1.0000 | ORAL_TABLET | Freq: Every day | ORAL | Status: DC
  Filled 2014-03-30: qty 1

## 2014-03-30 MED ORDER — POTASSIUM CHLORIDE CRYS ER 20 MEQ PO TBCR
40.0000 meq | EXTENDED_RELEASE_TABLET | Freq: Once | ORAL | Status: AC
  Administered 2014-03-30: 40 meq via ORAL
  Filled 2014-03-30: qty 2

## 2014-03-30 MED ORDER — THIAMINE HCL 100 MG/ML IJ SOLN
100.0000 mg | Freq: Every day | INTRAMUSCULAR | Status: DC
  Filled 2014-03-30: qty 1

## 2014-03-30 MED ORDER — LORAZEPAM 1 MG PO TABS: 1.0000 mg | ORAL_TABLET | Freq: Four times a day (QID) | ORAL | Status: DC | PRN

## 2014-03-30 MED ORDER — POTASSIUM CHLORIDE 10 MEQ/100ML IV SOLN
10.0000 meq | Freq: Once | INTRAVENOUS | Status: AC
  Administered 2014-03-30: 10 meq via INTRAVENOUS
  Filled 2014-03-30: qty 100

## 2014-03-30 MED ORDER — DEXTROSE 5 % IV SOLN
500.0000 mg | Freq: Once | INTRAVENOUS | Status: AC
  Administered 2014-03-31: 500 mg via INTRAVENOUS
  Filled 2014-03-30: qty 500

## 2014-03-30 MED ORDER — DEXTROSE 5 % IV SOLN: 500.0000 mg | INTRAVENOUS | Status: DC

## 2014-03-30 MED ORDER — HEPARIN SODIUM (PORCINE) 5000 UNIT/ML IJ SOLN
5000.0000 [IU] | Freq: Three times a day (TID) | INTRAMUSCULAR | Status: DC
  Administered 2014-03-31: 5000 [IU] via SUBCUTANEOUS
  Filled 2014-03-30: qty 1

## 2014-03-30 MED ORDER — IOHEXOL 300 MG/ML  SOLN
80.0000 mL | Freq: Once | INTRAMUSCULAR | Status: AC | PRN
  Administered 2014-03-30: 80 mL via INTRAVENOUS

## 2014-03-30 NOTE — ED Notes (Signed)
Please call sister-in-law or mother-in-law once pt gets to inpatient room. Sister-in-law: Wanda PlumpKim Ramseyer [phone] (home). Mother-in-law: Prentiss BellsClara Rufus.

## 2014-03-30 NOTE — ED Notes (Signed)
Pt voided and had diarrhea in the bedside commode, urine sample contaminated. Pt refusing in and out cath.

## 2014-03-30 NOTE — ED Notes (Signed)
MD informed of low Potassium.

## 2014-03-30 NOTE — H&P (Addendum)
Hospitalist Admission History and Physical  Patient name: Alicia Reed Medical record number: [id number] Date of birth: [birth year] Age: 54 y.o. Gender: female  Primary Care Provider: Doris Cheadle, MD  Chief Complaint: cough, cachexia, diarrhea, hypokalemia, alcohol abuse   History of Present Illness:This is a 54 y.o. year old female with significant past medical history of self reported tobacco abuse, ETOH abuse, no medical care presenting with cough, cachexia, diarrhea, hypokalemia, alcohol abuse. Pt reports cough, productive sputum, NBNB diarrhea over past 2-3 days. No fevers or chills. No nausea or vomiting. Drinks > 3 cans of beer daily. Smokes up to 2 PPD. Long standing weight loss per pt.  Presents to the ER afebrile, hemodynamically stable. WBC 8.7, hgb 9.6, Na 133, K 2.6, Cr 0.42, Alb 1.3, AST 54, ALP 256, mag 1.8. CT chest abd and pelvis shows third spacing, chronic pancreatitis, patchy airspace disease-edema vs. Atypical/opportunistic infection. R adnexal mass-likely benign. Nonspecific anal thickening.   Assessment and Plan:  Active Problems:   Cachexia   Cough   Diarrhea   Hypokalemia   Alcohol abuse   1- Cachexia -body weight in the 60s (lbs) -multifactorial in setting of multiple comorobidities including chronic pancreatitis, ETOH abuse, tobacco abuse,? Malignancy -no overt imaging concerning for ca  -tumor markers  -may benefit from thoracentesis of third spaced fluid  -HIV -TSH -prealbumin -hydrate -follow diet   2-Cough  -will cover w/ IV levaquin for CAP coverage given cachexia on presentation  -panculture -HIV -AFB smear  -no wheezing on exam in setting of longstanding tobacco abuse -hold steroids and inhalers -follow  3-Diarrhea -infectious vs secretory in setting of chronic pancreatitis -stool studies -c diff -IV levaquin and flagyl overnight   4- Hypokalemia -mg WNL  -replete  -follow  5-ETOH abuse -check ammonia level  -CIWA  protocol  -banana bag   6-Anemia -check anemia panel  -hemoccult   7-Elevated ALP  -check GGT  FEN/GI: heart healthy  Prophylaxis: sub q heparin  Disposition: pending further evaluatin  Code Status:Full Code    Patient Active Problem List   Diagnosis Date Noted  . Cachexia 03/30/2014  . Cough 03/30/2014  . Diarrhea 03/30/2014  . Hypokalemia 03/30/2014  . Alcohol abuse 03/30/2014   Past Medical History: History reviewed. No pertinent past medical history.  Past Surgical History: History reviewed. No pertinent past surgical history.  Social History: History   Social History  . Marital Status: Married    Spouse Name: N/A  . Number of Children: N/A  . Years of Education: N/A   Social History Main Topics  . Smoking status: Current Every Day Smoker    Types: Cigarettes  . Smokeless tobacco: Not on file  . Alcohol Use: Yes     Comment: occ  . Drug Use: No  . Sexual Activity: Not on file   Other Topics Concern  . None   Social History Narrative    Family History: History reviewed. No pertinent family history.  Allergies: Allergies  Allergen Reactions  . Penicillins Other (See Comments)    unknown    Current Facility-Administered Medications  Medication Dose Route Frequency Provider Last Rate Last Dose  . 0.9 % NaCl with KCl 40 mEq / L  infusion   Intravenous Continuous Doree Albee, MD      . azithromycin (ZITHROMAX) 500 mg in dextrose 5 % 250 mL IVPB  500 mg Intravenous Once Glynn Octave, MD      . cefTRIAXone (ROCEPHIN) 1 g in dextrose 5 % 50 mL IVPB  1 g Intravenous Once Glynn Octave, MD      . Melene Muller ON 03/31/2014] folic acid (FOLVITE) tablet 1 mg  1 mg Oral Daily Doree Albee, MD      . heparin injection 5,000 Units  5,000 Units Subcutaneous 3 times per day Doree Albee, MD      . levofloxacin (LEVAQUIN) IVPB 750 mg  750 mg Intravenous Q24H Doree Albee, MD      . LORazepam (ATIVAN) tablet 1 mg  1 mg Oral Q6H PRN Doree Albee, MD       Or   . LORazepam (ATIVAN) injection 1 mg  1 mg Intravenous Q6H PRN Doree Albee, MD      . metroNIDAZOLE (FLAGYL) IVPB 500 mg  500 mg Intravenous Q8H Doree Albee, MD      . Melene Muller ON 03/31/2014] multivitamin with minerals tablet 1 tablet  1 tablet Oral Daily Doree Albee, MD      . Melene Muller ON 03/31/2014] thiamine (VITAMIN B-1) tablet 100 mg  100 mg Oral Daily Doree Albee, MD       Or  . Melene Muller ON 03/31/2014] thiamine (B-1) injection 100 mg  100 mg Intravenous Daily Doree Albee, MD       Current Outpatient Prescriptions  Medication Sig Dispense Refill  . omeprazole (PRILOSEC OTC) 20 MG tablet Take 20 mg by mouth daily.     Review Of Systems: 12 point ROS negative except as noted above in HPI.  Physical Exam: Filed Vitals:   03/30/14 2100  BP: 127/74  Pulse: 84  Temp:   Resp: 21    General: alert, cooperative and severely cachectic  HEENT: PERRLA and extra ocular movement intact Heart: S1, S2 normal, no murmur, rub or gallop, regular rate and rhythm Lungs: clear to auscultation, no wheezes or rales and unlabored breathing Abdomen: abdomen is soft without significant tenderness, masses, organomegaly or guarding Extremities: extremities normal, atraumatic, no cyanosis or edema Skin:no rashes Neurology: generalized weakness, otherwise grossly normal   Labs and Imaging: Lab Results  Component Value Date/Time   NA 133* 03/30/2014 05:50 PM   K 2.6* 03/30/2014 05:50 PM   CL 96 03/30/2014 05:50 PM   CO2 26 03/30/2014 05:50 PM   BUN <5* 03/30/2014 05:50 PM   CREATININE 0.42* 03/30/2014 05:50 PM   GLUCOSE 188* 03/30/2014 05:50 PM   Lab Results  Component Value Date   WBC 8.7 03/30/2014   HGB 9.6* 03/30/2014   HCT 27.1* 03/30/2014   MCV 92.2 03/30/2014   PLT 250 03/30/2014         Dg Chest 2 View  03/30/2014   CLINICAL DATA:  Cough  EXAM: CHEST  2 VIEW  COMPARISON:  August 23, 2013  FINDINGS: The interstitium is mildly prominent in a generalized manner. There is no frank edema  or consolidation. There is a 6 mm nodular opacity in the right upper lobe, not present on prior study. Heart size and pulmonary vascularity are normal. No adenopathy. There is a paucity of body fat.  IMPRESSION: 6 mm nodular opacity right upper lobe, not present on prior study. Given a change from the previous study, noncontrast enhanced chest CT advised to further assess.  The interstitium is slightly prominent in a generalized manner. Suspect underlying chronic inflammatory type change. There is no frank edema or consolidation. No adenopathy.   Electronically Signed   By: Bretta Bang III M.D.   On: 03/30/2014 16:16   Ct Chest W Contrast  03/30/2014   CLINICAL DATA:  Weight  loss and pulmonary nodule.  EXAM: CT CHEST, ABDOMEN, AND PELVIS WITH CONTRAST  TECHNIQUE: Multidetector CT imaging of the chest, abdomen and pelvis was performed following the standard protocol during bolus administration of intravenous contrast.  CONTRAST:  80mL OMNIPAQUE IOHEXOL 300 MG/ML  SOLN  COMPARISON:  Abdominal CT 04/25/2007  FINDINGS: CT CHEST FINDINGS  THORACIC INLET/BODY WALL:  Severe cachexia with diffuse fat haziness.  No adenopathy  MEDIASTINUM:  No cardiomegaly. There is a small pericardial effusion. No acute vascular findings. There is atherosclerosis, best visualized on the descending aorta. Prominent mediastinal lymph nodes, but no definitive adenopathy.  LUNG WINDOWS:  Interlobular septal thickening at the bases with peripheral ground-glass density reaching to the apices. Small, simple layering pleural effusions. No suspicious nodule to explain the chest x-ray findings  OSSEOUS:  No acute fracture.  No suspicious lytic or blastic lesions.  CT ABDOMEN AND PELVIS FINDINGS  BODY WALL: Unremarkable.  Liver: Possible steatosis.  Biliary: Thick wall likely from contraction and edematous state.  Pancreas: Chronic pancreatitis has developed since 2009, with diffuse coarse calcifications and either cyst or focal ductal  enlargement in the uncinate process. There is no evidence of enhancing mass, although limited by diffuse retroperitoneal edema.  Spleen: Unremarkable.  Adrenals: Unremarkable.  Kidneys and ureters: No hydronephrosis or stone.  Bladder: Thick walled appearance is likely from underdistention.  Reproductive: Left cornual sub serosal fibroid measuring 2 cm. There is a right adnexal cystic mass measuring 62 mm, increased from 55 mm in 2009. There could be a inferior septation within calcification. No discrete enhancing nodule is identified.  Bowel: There is may be a degree of rectal prolapse. Linear high density in the circumferentially thickened anus is likely combination of escaping oral contrast and hemorrhoids. Visual inspection could confirm. The colon is diffusely distended with fluid levels; no obstruction or gross mass lesion. Negative small bowel  Retroperitoneum: Diffuse retroperitoneal edema from generalized edematous state.  Peritoneum: Small ascites.  Vascular: No acute abnormality.  OSSEOUS: No acute abnormalities.  IMPRESSION: 1. Third spacing presumably from the patient's severe hypoproteinemia. 2. Chronic pancreatitis which has developed from 2009. 3. Patchy airspace disease, likely pulmonary edema given interstitial thickening and #1, but atypical/opportunistic infection or inflammatory pneumonia could also give this appearance. 4. 6 cm slowly enlarging cystic mass in the right adnexa, primarily concerning for a benign epithelial neoplasm. This would not explain the patient's cachexia. 5. Nonspecific thickening of the anus, correlate with exam.   Electronically Signed   By: Marnee SpringJonathon  Watts M.D.   On: 03/30/2014 22:08   Ct Abdomen Pelvis W Contrast  03/30/2014   CLINICAL DATA:  Weight loss and pulmonary nodule.  EXAM: CT CHEST, ABDOMEN, AND PELVIS WITH CONTRAST  TECHNIQUE: Multidetector CT imaging of the chest, abdomen and pelvis was performed following the standard protocol during bolus administration  of intravenous contrast.  CONTRAST:  80mL OMNIPAQUE IOHEXOL 300 MG/ML  SOLN  COMPARISON:  Abdominal CT 04/25/2007  FINDINGS: CT CHEST FINDINGS  THORACIC INLET/BODY WALL:  Severe cachexia with diffuse fat haziness.  No adenopathy  MEDIASTINUM:  No cardiomegaly. There is a small pericardial effusion. No acute vascular findings. There is atherosclerosis, best visualized on the descending aorta. Prominent mediastinal lymph nodes, but no definitive adenopathy.  LUNG WINDOWS:  Interlobular septal thickening at the bases with peripheral ground-glass density reaching to the apices. Small, simple layering pleural effusions. No suspicious nodule to explain the chest x-ray findings  OSSEOUS:  No acute fracture.  No suspicious lytic or blastic lesions.  CT ABDOMEN AND PELVIS FINDINGS  BODY WALL: Unremarkable.  Liver: Possible steatosis.  Biliary: Thick wall likely from contraction and edematous state.  Pancreas: Chronic pancreatitis has developed since 2009, with diffuse coarse calcifications and either cyst or focal ductal enlargement in the uncinate process. There is no evidence of enhancing mass, although limited by diffuse retroperitoneal edema.  Spleen: Unremarkable.  Adrenals: Unremarkable.  Kidneys and ureters: No hydronephrosis or stone.  Bladder: Thick walled appearance is likely from underdistention.  Reproductive: Left cornual sub serosal fibroid measuring 2 cm. There is a right adnexal cystic mass measuring 62 mm, increased from 55 mm in 2009. There could be a inferior septation within calcification. No discrete enhancing nodule is identified.  Bowel: There is may be a degree of rectal prolapse. Linear high density in the circumferentially thickened anus is likely combination of escaping oral contrast and hemorrhoids. Visual inspection could confirm. The colon is diffusely distended with fluid levels; no obstruction or gross mass lesion. Negative small bowel  Retroperitoneum: Diffuse retroperitoneal edema from  generalized edematous state.  Peritoneum: Small ascites.  Vascular: No acute abnormality.  OSSEOUS: No acute abnormalities.  IMPRESSION: 1. Third spacing presumably from the patient's severe hypoproteinemia. 2. Chronic pancreatitis which has developed from 2009. 3. Patchy airspace disease, likely pulmonary edema given interstitial thickening and #1, but atypical/opportunistic infection or inflammatory pneumonia could also give this appearance. 4. 6 cm slowly enlarging cystic mass in the right adnexa, primarily concerning for a benign epithelial neoplasm. This would not explain the patient's cachexia. 5. Nonspecific thickening of the anus, correlate with exam.   Electronically Signed   By: Marnee Spring M.D.   On: 03/30/2014 22:08           Doree Albee MD  Pager: [phone]

## 2014-03-30 NOTE — ED Notes (Addendum)
Pt in c/o cough for a few days, productive with yellow sputum, unsure of fever at home, no distress noted- also reports diarrhea after eating for the last two days

## 2014-03-30 NOTE — ED Notes (Signed)
CT called, states pt on 2 hours oral contrast; will be picked up at 2115.

## 2014-03-30 NOTE — ED Provider Notes (Signed)
CSN: [id number]     Arrival date & time 03/30/14  1529 History   First MD Initiated Contact with Patient 03/30/14 1700     Chief Complaint  Patient presents with  . Cough  . Diarrhea     (Consider location/radiation/quality/duration/timing/severity/associated sxs/prior Treatment) HPI Comments: Patient from home with 3 day history of cough, productive of yellow sputum, and 3 days of diarrhea. Unsure fevers.  No recent travel. States she has had some night sweats unintentional weight loss. Patient is a smoker. She denies any other medical history. She denies any abdominal pain, headache, vomiting. She endorses poor appetite. She denies any dysuria, hematuria, vaginal bleeding or discharge. Diarrhea is nonbloody and about 5-6 times daily. He denies anyvomiting. Denies any chest pain or shortness of breath. Denies any abdominal pain. She does not have a doctor.   The history is provided by the patient.    History reviewed. No pertinent past medical history. History reviewed. No pertinent past surgical history. History reviewed. No pertinent family history. History  Substance Use Topics  . Smoking status: Current Every Day Smoker    Types: Cigarettes  . Smokeless tobacco: Not on file  . Alcohol Use: Yes     Comment: occ   OB History    No data available     Review of Systems  Constitutional: Positive for activity change, appetite change and fatigue. Negative for fever.  HENT: Negative for congestion and rhinorrhea.   Eyes: Negative for visual disturbance.  Respiratory: Positive for cough.   Gastrointestinal: Positive for diarrhea. Negative for nausea, vomiting and abdominal pain.  Genitourinary: Negative for dysuria and hematuria.  Musculoskeletal: Positive for myalgias and arthralgias.  Skin: Negative for wound.  Neurological: Positive for weakness. Negative for dizziness and headaches.  A complete 10 system review of systems was obtained and all systems are negative except as  noted in the HPI and PMH.      Allergies  Penicillins  Home Medications   Prior to Admission medications   Medication Sig Start Date End Date Taking? Authorizing Provider  omeprazole (PRILOSEC OTC) 20 MG tablet Take 20 mg by mouth daily.   Yes Historical Provider, MD   BP 105/60 mmHg  Pulse 75  Temp(Src) 98.1 F (36.7 C) (Oral)  Resp 19  Ht  (1.473 m)  Wt 63 lb 7 oz (28.775 kg)  BMI 13.26 kg/m2  SpO2 98% Physical Exam  Constitutional: She is oriented to person, place, and time. She appears well-developed. No distress.  Cachectic, dry mucous membranes  HENT:  Head: Normocephalic and atraumatic.  Mouth/Throat: Oropharynx is clear and moist. No oropharyngeal exudate.  Eyes: Conjunctivae and EOM are normal. Pupils are equal, round, and reactive to light.  Neck: Normal range of motion. Neck supple.  No meningismus.  Cardiovascular: Normal rate, regular rhythm, normal heart sounds and intact distal pulses.   No murmur heard. Pulmonary/Chest: Effort normal and breath sounds normal. No respiratory distress.  Abdominal: Soft. There is no tenderness. There is no rebound and no guarding.  Musculoskeletal: Normal range of motion. She exhibits no edema or tenderness.  Neurological: She is alert and oriented to person, place, and time. No cranial nerve deficit. She exhibits normal muscle tone. Coordination normal.  No ataxia on finger to nose bilaterally. No pronator drift. 5/5 strength throughout. CN 2-12 intact. Negative Romberg. Equal grip strength. Sensation intact. Gait is normal.   Skin: Skin is warm.  Psychiatric: She has a normal mood and affect. Her behavior is normal.  Nursing note and vitals reviewed.   ED Course  Procedures (including critical care time) Labs Review Labs Reviewed  CBC WITH DIFFERENTIAL/PLATELET - Abnormal; Notable for the following:    RBC 2.94 (*)    Hemoglobin 9.6 (*)    HCT 27.1 (*)    All other components within normal limits   COMPREHENSIVE METABOLIC PANEL - Abnormal; Notable for the following:    Sodium 133 (*)    Potassium 2.6 (*)    Glucose, Bld 188 (*)    BUN <5 (*)    Creatinine, Ser 0.42 (*)    Calcium 7.1 (*)    Total Protein 4.9 (*)    Albumin 1.3 (*)    AST 54 (*)    Alkaline Phosphatase 256 (*)    All other components within normal limits  CULTURE, BLOOD (ROUTINE X 2)  CULTURE, BLOOD (ROUTINE X 2)  CULTURE, EXPECTORATED SPUTUM-ASSESSMENT  GRAM STAIN  STOOL CULTURE  CLOSTRIDIUM DIFFICILE BY PCR  OVA AND PARASITE EXAMINATION  AFB CULTURE WITH SMEAR  LIPASE, BLOOD  TROPONIN I  MAGNESIUM  URINALYSIS, ROUTINE W REFLEX MICROSCOPIC  HIV ANTIBODY (ROUTINE TESTING)  LEGIONELLA ANTIGEN, URINE  STREP PNEUMONIAE URINARY ANTIGEN  CBC  CREATININE, SERUM  CBC WITH DIFFERENTIAL/PLATELET  COMPREHENSIVE METABOLIC PANEL  CEA  AFP TUMOR MARKER  CANCER ANTIGEN 15-3  CANCER ANTIGEN 19-9  CA 125  LACTATE DEHYDROGENASE  HCG, SERUM, QUALITATIVE  URINE RAPID DRUG SCREEN (HOSP PERFORMED)  TSH    Imaging Review Dg Chest 2 View  03/30/2014   CLINICAL DATA:  Cough  EXAM: CHEST  2 VIEW  COMPARISON:  August 23, 2013  FINDINGS: The interstitium is mildly prominent in a generalized manner. There is no frank edema or consolidation. There is a 6 mm nodular opacity in the right upper lobe, not present on prior study. Heart size and pulmonary vascularity are normal. No adenopathy. There is a paucity of body fat.  IMPRESSION: 6 mm nodular opacity right upper lobe, not present on prior study. Given a change from the previous study, noncontrast enhanced chest CT advised to further assess.  The interstitium is slightly prominent in a generalized manner. Suspect underlying chronic inflammatory type change. There is no frank edema or consolidation. No adenopathy.   Electronically Signed   By: Bretta BangWilliam  Woodruff III M.D.   On: 03/30/2014 16:16   Ct Chest W Contrast  03/30/2014   CLINICAL DATA:  Weight loss and pulmonary nodule.   EXAM: CT CHEST, ABDOMEN, AND PELVIS WITH CONTRAST  TECHNIQUE: Multidetector CT imaging of the chest, abdomen and pelvis was performed following the standard protocol during bolus administration of intravenous contrast.  CONTRAST:  80mL OMNIPAQUE IOHEXOL 300 MG/ML  SOLN  COMPARISON:  Abdominal CT 04/25/2007  FINDINGS: CT CHEST FINDINGS  THORACIC INLET/BODY WALL:  Severe cachexia with diffuse fat haziness.  No adenopathy  MEDIASTINUM:  No cardiomegaly. There is a small pericardial effusion. No acute vascular findings. There is atherosclerosis, best visualized on the descending aorta. Prominent mediastinal lymph nodes, but no definitive adenopathy.  LUNG WINDOWS:  Interlobular septal thickening at the bases with peripheral ground-glass density reaching to the apices. Small, simple layering pleural effusions. No suspicious nodule to explain the chest x-ray findings  OSSEOUS:  No acute fracture.  No suspicious lytic or blastic lesions.  CT ABDOMEN AND PELVIS FINDINGS  BODY WALL: Unremarkable.  Liver: Possible steatosis.  Biliary: Thick wall likely from contraction and edematous state.  Pancreas: Chronic pancreatitis has developed since 2009, with diffuse coarse  calcifications and either cyst or focal ductal enlargement in the uncinate process. There is no evidence of enhancing mass, although limited by diffuse retroperitoneal edema.  Spleen: Unremarkable.  Adrenals: Unremarkable.  Kidneys and ureters: No hydronephrosis or stone.  Bladder: Thick walled appearance is likely from underdistention.  Reproductive: Left cornual sub serosal fibroid measuring 2 cm. There is a right adnexal cystic mass measuring 62 mm, increased from 55 mm in 2009. There could be a inferior septation within calcification. No discrete enhancing nodule is identified.  Bowel: There is may be a degree of rectal prolapse. Linear high density in the circumferentially thickened anus is likely combination of escaping oral contrast and hemorrhoids. Visual  inspection could confirm. The colon is diffusely distended with fluid levels; no obstruction or gross mass lesion. Negative small bowel  Retroperitoneum: Diffuse retroperitoneal edema from generalized edematous state.  Peritoneum: Small ascites.  Vascular: No acute abnormality.  OSSEOUS: No acute abnormalities.  IMPRESSION: 1. Third spacing presumably from the patient's severe hypoproteinemia. 2. Chronic pancreatitis which has developed from 2009. 3. Patchy airspace disease, likely pulmonary edema given interstitial thickening and #1, but atypical/opportunistic infection or inflammatory pneumonia could also give this appearance. 4. 6 cm slowly enlarging cystic mass in the right adnexa, primarily concerning for a benign epithelial neoplasm. This would not explain the patient's cachexia. 5. Nonspecific thickening of the anus, correlate with exam.   Electronically Signed   By: Marnee Spring M.D.   On: 03/30/2014 22:08   Ct Abdomen Pelvis W Contrast  03/30/2014   CLINICAL DATA:  Weight loss and pulmonary nodule.  EXAM: CT CHEST, ABDOMEN, AND PELVIS WITH CONTRAST  TECHNIQUE: Multidetector CT imaging of the chest, abdomen and pelvis was performed following the standard protocol during bolus administration of intravenous contrast.  CONTRAST:  80mL OMNIPAQUE IOHEXOL 300 MG/ML  SOLN  COMPARISON:  Abdominal CT 04/25/2007  FINDINGS: CT CHEST FINDINGS  THORACIC INLET/BODY WALL:  Severe cachexia with diffuse fat haziness.  No adenopathy  MEDIASTINUM:  No cardiomegaly. There is a small pericardial effusion. No acute vascular findings. There is atherosclerosis, best visualized on the descending aorta. Prominent mediastinal lymph nodes, but no definitive adenopathy.  LUNG WINDOWS:  Interlobular septal thickening at the bases with peripheral ground-glass density reaching to the apices. Small, simple layering pleural effusions. No suspicious nodule to explain the chest x-ray findings  OSSEOUS:  No acute fracture.  No suspicious  lytic or blastic lesions.  CT ABDOMEN AND PELVIS FINDINGS  BODY WALL: Unremarkable.  Liver: Possible steatosis.  Biliary: Thick wall likely from contraction and edematous state.  Pancreas: Chronic pancreatitis has developed since 2009, with diffuse coarse calcifications and either cyst or focal ductal enlargement in the uncinate process. There is no evidence of enhancing mass, although limited by diffuse retroperitoneal edema.  Spleen: Unremarkable.  Adrenals: Unremarkable.  Kidneys and ureters: No hydronephrosis or stone.  Bladder: Thick walled appearance is likely from underdistention.  Reproductive: Left cornual sub serosal fibroid measuring 2 cm. There is a right adnexal cystic mass measuring 62 mm, increased from 55 mm in 2009. There could be a inferior septation within calcification. No discrete enhancing nodule is identified.  Bowel: There is may be a degree of rectal prolapse. Linear high density in the circumferentially thickened anus is likely combination of escaping oral contrast and hemorrhoids. Visual inspection could confirm. The colon is diffusely distended with fluid levels; no obstruction or gross mass lesion. Negative small bowel  Retroperitoneum: Diffuse retroperitoneal edema from generalized edematous state.  Peritoneum:  Small ascites.  Vascular: No acute abnormality.  OSSEOUS: No acute abnormalities.  IMPRESSION: 1. Third spacing presumably from the patient's severe hypoproteinemia. 2. Chronic pancreatitis which has developed from 2009. 3. Patchy airspace disease, likely pulmonary edema given interstitial thickening and #1, but atypical/opportunistic infection or inflammatory pneumonia could also give this appearance. 4. 6 cm slowly enlarging cystic mass in the right adnexa, primarily concerning for a benign epithelial neoplasm. This would not explain the patient's cachexia. 5. Nonspecific thickening of the anus, correlate with exam.   Electronically Signed   By: Marnee Spring M.D.   On:  03/30/2014 22:08     EKG Interpretation   Date/Time:  Thursday March 30 2014 17:42:01 EST Ventricular Rate:  82 PR Interval:  158 QRS Duration: 87 QT Interval:  389 QTC Calculation: 454 R Axis:   82 Text Interpretation:  Sinus rhythm Baseline wander in lead(s) V1 No  previous ECGs available Confirmed by Manus Gunning  MD, Anav Lammert [phone]) on  03/30/2014 6:07:12 PM      MDM   Final diagnoses:  CAP (community acquired pneumonia)  Cachexia   Three-day history of cough productive of sputum, diarrhea. No abdominal pain or vomiting.  Patient with hypokalemia and elevated LFTs. Chest x-ray shows lung nodule not seen previously.  Given cachexia, concerning for underlying malignancy.  Imaging shows third spacing, chronic pancreatitis, patchy airspace disease concerning for edema versus infection. Cystic mass in the right adnexa which does not appear to be malignant.  Patient with severe hyperkalemia with normal magnesium. Electrolytes are repleted. She is given IV fluids. Discussed with patient concerning findings and concern for occult malignancy. She does not have a physician.  Given her cachectic appearance, with full abnormalities, possible pneumonia, will admit to the hospital. Treat for community acquired pneumonia.   Glynn Octave, MD 03/31/14 [phone]

## 2014-03-31 ENCOUNTER — Observation Stay (HOSPITAL_COMMUNITY): Payer: Self-pay

## 2014-03-31 DIAGNOSIS — F1010 Alcohol abuse, uncomplicated: Secondary | ICD-10-CM

## 2014-03-31 DIAGNOSIS — R05 Cough: Secondary | ICD-10-CM

## 2014-03-31 DIAGNOSIS — E876 Hypokalemia: Secondary | ICD-10-CM

## 2014-03-31 DIAGNOSIS — R197 Diarrhea, unspecified: Secondary | ICD-10-CM

## 2014-03-31 LAB — COMPREHENSIVE METABOLIC PANEL
ALT: 17 U/L (ref 0–35)
AST: 58 U/L — AB (ref 0–37)
Albumin: 1.1 g/dL — ABNORMAL LOW (ref 3.5–5.2)
Alkaline Phosphatase: 251 U/L — ABNORMAL HIGH (ref 39–117)
Anion gap: 6 (ref 5–15)
BILIRUBIN TOTAL: 0.5 mg/dL (ref 0.3–1.2)
BUN: <5 mg/dL — ABNORMAL LOW (ref 6–23)
CO2: 25 mmol/L (ref 19–32)
Calcium: 6.5 mg/dL — ABNORMAL LOW (ref 8.4–10.5)
Chloride: 100 mmol/L (ref 96–112)
Creatinine, Ser: 0.43 mg/dL — ABNORMAL LOW (ref 0.50–1.10)
GFR calc Af Amer: >90 mL/min (ref >90)
GLUCOSE: 163 mg/dL — AB (ref 70–99)
Potassium: 2.5 mmol/L — CL (ref 3.5–5.1)
SODIUM: 131 mmol/L — AB (ref 135–145)
Total Protein: 4.1 g/dL — ABNORMAL LOW (ref 6.0–8.3)

## 2014-03-31 LAB — CBC WITH DIFFERENTIAL/PLATELET
BASOS ABS: 0 10*3/uL (ref 0.0–0.1)
Basophils Relative: 0 % (ref 0–1)
Eosinophils Absolute: 0 10*3/uL (ref 0.0–0.7)
Eosinophils Relative: 0 % (ref 0–5)
HEMATOCRIT: 23.4 % — AB (ref 36.0–46.0)
Hemoglobin: 8.1 g/dL — ABNORMAL LOW (ref 12.0–15.0)
LYMPHS PCT: 25 % (ref 12–46)
Lymphs Abs: 1.6 10*3/uL (ref 0.7–4.0)
MCH: 31.9 pg (ref 26.0–34.0)
MCHC: 34.6 g/dL (ref 30.0–36.0)
MCV: 92.1 fL (ref 78.0–100.0)
Monocytes Absolute: 0.3 10*3/uL (ref 0.1–1.0)
Monocytes Relative: 5 % (ref 3–12)
NEUTROS ABS: 4.6 10*3/uL (ref 1.7–7.7)
Neutrophils Relative %: 70 % (ref 43–77)
PLATELETS: 221 10*3/uL (ref 150–400)
RBC: 2.54 MIL/uL — ABNORMAL LOW (ref 3.87–5.11)
RDW: 13.8 % (ref 11.5–15.5)
WBC: 6.5 10*3/uL (ref 4.0–10.5)

## 2014-03-31 LAB — CBC
HCT: 24.4 % — ABNORMAL LOW (ref 36.0–46.0)
Hemoglobin: 8.7 g/dL — ABNORMAL LOW (ref 12.0–15.0)
MCH: 32.7 pg (ref 26.0–34.0)
MCHC: 35.7 g/dL (ref 30.0–36.0)
MCV: 91.7 fL (ref 78.0–100.0)
Platelets: 228 10*3/uL (ref 150–400)
RBC: 2.66 MIL/uL — ABNORMAL LOW (ref 3.87–5.11)
RDW: 13.8 % (ref 11.5–15.5)
WBC: 7.4 10*3/uL (ref 4.0–10.5)

## 2014-03-31 LAB — CANCER ANTIGEN 15-3: Cancer Antigen-Breast 15-3: 105 U/mL — ABNORMAL HIGH (ref <32)

## 2014-03-31 LAB — IRON AND TIBC
IRON: 43 ug/dL (ref 42–145)
UIBC: <15 ug/dL — ABNORMAL LOW (ref 125–400)

## 2014-03-31 LAB — HCG, SERUM, QUALITATIVE: Preg, Serum: NEGATIVE

## 2014-03-31 LAB — POTASSIUM: POTASSIUM: 3.2 mmol/L — AB (ref 3.5–5.1)

## 2014-03-31 LAB — GAMMA GT: GGT: 467 U/L — AB (ref 7–51)

## 2014-03-31 LAB — VITAMIN B12: VITAMIN B 12: 1693 pg/mL — AB (ref 211–911)

## 2014-03-31 LAB — MRSA PCR SCREENING: MRSA BY PCR: NEGATIVE

## 2014-03-31 LAB — LACTATE DEHYDROGENASE: LDH: 374 U/L — ABNORMAL HIGH (ref 94–250)

## 2014-03-31 LAB — TSH: TSH: 1.144 u[IU]/mL (ref 0.350–4.500)

## 2014-03-31 LAB — CREATININE, SERUM
Creatinine, Ser: 0.42 mg/dL — ABNORMAL LOW (ref 0.50–1.10)
GFR calc Af Amer: >90 mL/min (ref >90)
GFR calc non Af Amer: >90 mL/min (ref >90)

## 2014-03-31 LAB — RETICULOCYTES
RBC.: 2.79 MIL/uL — ABNORMAL LOW (ref 3.87–5.11)
RETIC CT PCT: 1.9 % (ref 0.4–3.1)
Retic Count, Absolute: 53 10*3/uL (ref 19.0–186.0)

## 2014-03-31 LAB — FERRITIN: Ferritin: 502 ng/mL — ABNORMAL HIGH (ref 10–291)

## 2014-03-31 LAB — FOLATE: Folate: 7.6 ng/mL

## 2014-03-31 MED ORDER — ENSURE COMPLETE PO LIQD
237.0000 mL | Freq: Two times a day (BID) | ORAL | Status: DC
  Administered 2014-03-31: 237 mL via ORAL

## 2014-03-31 MED ORDER — POTASSIUM CHLORIDE CRYS ER 20 MEQ PO TBCR
40.0000 meq | EXTENDED_RELEASE_TABLET | Freq: Once | ORAL | Status: AC
  Administered 2014-03-31: 40 meq via ORAL
  Filled 2014-03-31: qty 2

## 2014-03-31 MED ORDER — ENSURE COMPLETE PO LIQD: 237.0000 mL | Freq: Three times a day (TID) | ORAL | Status: DC

## 2014-03-31 MED ORDER — LEVOFLOXACIN IN D5W 750 MG/150ML IV SOLN: 750.0000 mg | Freq: Every day | INTRAVENOUS | Status: DC

## 2014-03-31 MED ORDER — POTASSIUM CHLORIDE 10 MEQ/100ML IV SOLN
10.0000 meq | INTRAVENOUS | Status: AC
  Administered 2014-03-31 (×2): 10 meq via INTRAVENOUS
  Filled 2014-03-31 (×4): qty 100

## 2014-03-31 MED ORDER — LEVOFLOXACIN IN D5W 750 MG/150ML IV SOLN
750.0000 mg | INTRAVENOUS | Status: DC
  Filled 2014-03-31: qty 150

## 2014-03-31 NOTE — ED Notes (Signed)
Patient is resting comfortably. 

## 2014-03-31 NOTE — Progress Notes (Signed)
INITIAL NUTRITION ASSESSMENT  DOCUMENTATION CODES Per approved criteria    -Severe malnutrition in the context of chronic illness -Underweight   INTERVENTION:  Ensure Complete PO TID, each supplement provides 350 kcal and 13 grams of protein.  Recommend liberalize diet to regular to increase calorie provision.  NUTRITION DIAGNOSIS: Malnutrition related to inadequate oral intake with history of ETOH abuse as evidenced by severe depletion of muscle and subcutaneous fat mass.   Goal: Intake to meet >90% of estimated nutrition needs.  Monitor:  PO intake, labs, weight trend.  Reason for Assessment: Malnutrition Screening Tool; Low BMI  54 y.o. female  Admitting Dx: Cough, cachexia, diarrhea, hypokalemia, alcohol abuse  ASSESSMENT: Patient presented to the ED on 3/3 with cough, diarrhea. CT chest abd and pelvis shows third spacing, chronic pancreatitis, patchy airspace disease-edema vs. atypical/opportunistic infection. Patient with PMH of tobacco and ETOH abuse, no medical care.  Patient reports that she likes Ensure, but she doesn't have enough money to buy it for herself. She knows that she is small, but doesn't know how to gain weight. Discussed ways to increase protein and calorie intake.  Nutrition Focused Physical Exam:  Subcutaneous Fat:  Orbital Region: mild depletion Upper Arm Region: severe depletion Thoracic and Lumbar Region: severe depletion  Muscle:  Temple Region: moderate depletion Clavicle Bone Region: severe depletion Clavicle and Acromion Bone Region: severe depletion Scapular Bone Region: severe depletion Dorsal Hand: mild depletion Patellar Region: severe depletion Anterior Thigh Region: severe depletion Posterior Calf Region: severe depletion  Edema: none   Height: Ht Readings from Last 1 Encounters:  03/30/14  (1.473 m)    Weight: Wt Readings from Last 1 Encounters:  03/31/14 68 lb (30.845 kg)    Ideal Body Weight: 44 kg  %  Ideal Body Weight: 70%  Wt Readings from Last 10 Encounters:  03/31/14 68 lb (30.845 kg)  08/23/13 59 lb 9.6 oz (27.034 kg)    Usual Body Weight: 59 lbs  % Usual Body Weight: 115%  BMI:  Body mass index is 14.22 kg/(m^2).  Estimated Nutritional Needs: Kcal: 1300-1500 Protein: 65-75 gm Fluid: 1.5 L  Skin: WDL  Diet Order: Diet Heart  EDUCATION NEEDS: -Education needs addressed   Intake/Output Summary (Last 24 hours) at 03/31/14 1348 Last data filed at 03/31/14 0651  Gross per 24 hour  Intake 456.67 ml  Output      0 ml  Net 456.67 ml    Last BM: 3/4   Labs:   Recent Labs Lab 03/30/14 1750 03/30/14 1759 03/30/14 2336 03/31/14 0315 03/31/14 0957  NA 133*  --   --  131*  --   K 2.6*  --   --  2.5* 3.2*  CL 96  --   --  100  --   CO2 26  --   --  25  --   BUN <5*  --   --  <5*  --   CREATININE 0.42*  --  0.42* 0.43*  --   CALCIUM 7.1*  --   --  6.5*  --   MG  --  1.8  --   --   --   GLUCOSE 188*  --   --  163*  --     CBG (last 3)  No results for input(s): GLUCAP in the last 72 hours.  Scheduled Meds: . feeding supplement (ENSURE COMPLETE)  237 mL Oral BID BM  . folic acid  1 mg Oral Daily  . heparin  5,000  Units Subcutaneous 3 times per day  . levofloxacin (LEVAQUIN) IV  750 mg Intravenous Q48H  . metronidazole  500 mg Intravenous Q8H  . multivitamin with minerals  1 tablet Oral Daily  . thiamine  100 mg Oral Daily    Continuous Infusions: . 0.9 % NaCl with KCl 40 mEq / L 100 mL/hr (03/31/14 0517)    History reviewed. No pertinent past medical history.  History reviewed. No pertinent past surgical history.   Joaquin CourtsKimberly Harris, RD, LDN, CNSC Pager [phone] After Hours Pager [phone]

## 2014-03-31 NOTE — Progress Notes (Signed)
[facility]ed [facility] CenterMaggie, nurse at [phone].

## 2014-03-31 NOTE — Progress Notes (Signed)
[facility] PROGRESS NOTE  Alicia Reed ZOX:[id number] DOB: 04/21/1960 DOA: 03/30/2014 PCP: Doris CheadleADVANI, DEEPAK, MD  Assessment/Plan: Active Problems:   Cachexia - Could be 2ary to alcohol use. Recommending cessation and improvement in oral intake.    Cough - Patient afebrile and wbc within normal limits. Low index of suspicion for Pneumonia.    Diarrhea - resolving patient reports one bout today. - obtain GI pathogen panel    Hypokalemia - improved after replacement.  - Will reassess and replete if necessary  - recommend continuing monitoring     Alcohol abuse - recommended cessation. - CIWA protocol while in house.   Code Status: full Family Communication: no family at bedside  Disposition Plan: pending improvement in condition   Consultants:  none  Procedures:  none  Antibiotics:  Metronidazole and flagyl  HPI/Subjective: Pt wishes to go home soon.   Objective: Filed Vitals:   03/31/14 1100  BP: 90/62  Pulse: 59  Temp: 98.1 F (36.7 C)  Resp: 21    Intake/Output Summary (Last 24 hours) at 03/31/14 1343 Last data filed at 03/31/14 0651  Gross per 24 hour  Intake 456.67 ml  Output      0 ml  Net 456.67 ml   Filed Weights   03/30/14 1552 03/31/14 0416  Weight: 28.775 kg (63 lb 7 oz) 30.845 kg (68 lb)    Exam:   General:  Pt in alert and awake  Cardiovascular: rrr, no mrg  Respiratory: cta bl, no wheezes, no increased wob  Abdomen: soft, NT, ND  Musculoskeletal: no cyanosis or clubbing   Data Reviewed: Basic Metabolic Panel:  Recent Labs Lab 03/30/14 1750 03/30/14 1759 03/30/14 2336 03/31/14 0315 03/31/14 0957  NA 133*  --   --  131*  --   K 2.6*  --   --  2.5* 3.2*  CL 96  --   --  100  --   CO2 26  --   --  25  --   GLUCOSE 188*  --   --  163*  --   BUN <5*  --   --  <5*  --   CREATININE 0.42*  --  0.42* 0.43*  --   CALCIUM 7.1*  --   --  6.5*  --   MG  --  1.8  --   --   --    Liver Function Tests:  Recent  Labs Lab 03/30/14 1750 03/31/14 0315  AST 54* 58*  ALT 17 17  ALKPHOS 256* 251*  BILITOT 0.6 0.5  PROT 4.9* 4.1*  ALBUMIN 1.3* 1.1*    Recent Labs Lab 03/30/14 1750  LIPASE 13   No results for input(s): AMMONIA in the last 168 hours. CBC:  Recent Labs Lab 03/30/14 1750 03/30/14 2336 03/31/14 0315  WBC 8.7 7.4 6.5  NEUTROABS 6.3  --  4.6  HGB 9.6* 8.7* 8.1*  HCT 27.1* 24.4* 23.4*  MCV 92.2 91.7 92.1  PLT 250 228 221   Cardiac Enzymes:  Recent Labs Lab 03/30/14 1750  TROPONINI <0.03   BNP (last 3 results) No results for input(s): BNP in the last 8760 hours.  ProBNP (last 3 results) No results for input(s): PROBNP in the last 8760 hours.  CBG: No results for input(s): GLUCAP in the last 168 hours.  Recent Results (from the past 240 hour(s))  MRSA PCR Screening     Status: None   Collection Time: 03/31/14 10:05 AM  Result Value Ref Range Status  MRSA by PCR NEGATIVE NEGATIVE Final    Comment:        The GeneXpert MRSA Assay (FDA approved for NASAL specimens only), is one component of a comprehensive MRSA colonization surveillance program. It is not intended to diagnose MRSA infection nor to guide or monitor treatment for MRSA infections.      Studies: Dg Chest 2 View  03/30/2014   CLINICAL DATA:  Cough  EXAM: CHEST  2 VIEW  COMPARISON:  August 23, 2013  FINDINGS: The interstitium is mildly prominent in a generalized manner. There is no frank edema or consolidation. There is a 6 mm nodular opacity in the right upper lobe, not present on prior study. Heart size and pulmonary vascularity are normal. No adenopathy. There is a paucity of body fat.  IMPRESSION: 6 mm nodular opacity right upper lobe, not present on prior study. Given a change from the previous study, noncontrast enhanced chest CT advised to further assess.  The interstitium is slightly prominent in a generalized manner. Suspect underlying chronic inflammatory type change. There is no frank edema  or consolidation. No adenopathy.   Electronically Signed   By: Bretta Bang III M.D.   On: 03/30/2014 16:16   Ct Head Wo Contrast  03/31/2014   CLINICAL DATA:  Cachexia, chronic in nature.  Initial encounter.  EXAM: CT HEAD WITHOUT CONTRAST  TECHNIQUE: Contiguous axial images were obtained from the base of the skull through the vertex without intravenous contrast.  COMPARISON:  None.  FINDINGS: There is no evidence of acute infarction, mass lesion, or intra- or extra-axial hemorrhage on CT.  Prominence of the ventricles and sulci reflects mild cortical volume loss. Mild cerebellar atrophy is noted. Mild periventricular white matter change likely reflects small vessel ischemic microangiopathy.  The brainstem and fourth ventricle are within normal limits. The basal ganglia are unremarkable in appearance. The cerebral hemispheres demonstrate grossly normal gray-white differentiation. No mass effect or midline shift is seen.  There is no evidence of fracture; visualized osseous structures are unremarkable in appearance. The visualized portions of the orbits are within normal limits. The paranasal sinuses and mastoid air cells are well-aerated. No significant soft tissue abnormalities are seen.  IMPRESSION: 1. No acute intracranial abnormality seen on CT. 2. Mild cortical volume loss and scattered small vessel ischemic microangiopathy.   Electronically Signed   By: Roanna Raider M.D.   On: 03/31/2014 01:15   Ct Chest W Contrast  03/30/2014   CLINICAL DATA:  Weight loss and pulmonary nodule.  EXAM: CT CHEST, ABDOMEN, AND PELVIS WITH CONTRAST  TECHNIQUE: Multidetector CT imaging of the chest, abdomen and pelvis was performed following the standard protocol during bolus administration of intravenous contrast.  CONTRAST:  80mL OMNIPAQUE IOHEXOL 300 MG/ML  SOLN  COMPARISON:  Abdominal CT 04/25/2007  FINDINGS: CT CHEST FINDINGS  THORACIC INLET/BODY WALL:  Severe cachexia with diffuse fat haziness.  No adenopathy   MEDIASTINUM:  No cardiomegaly. There is a small pericardial effusion. No acute vascular findings. There is atherosclerosis, best visualized on the descending aorta. Prominent mediastinal lymph nodes, but no definitive adenopathy.  LUNG WINDOWS:  Interlobular septal thickening at the bases with peripheral ground-glass density reaching to the apices. Small, simple layering pleural effusions. No suspicious nodule to explain the chest x-ray findings  OSSEOUS:  No acute fracture.  No suspicious lytic or blastic lesions.  CT ABDOMEN AND PELVIS FINDINGS  BODY WALL: Unremarkable.  Liver: Possible steatosis.  Biliary: Thick wall likely from contraction and edematous state.  Pancreas: Chronic pancreatitis has developed since 2009, with diffuse coarse calcifications and either cyst or focal ductal enlargement in the uncinate process. There is no evidence of enhancing mass, although limited by diffuse retroperitoneal edema.  Spleen: Unremarkable.  Adrenals: Unremarkable.  Kidneys and ureters: No hydronephrosis or stone.  Bladder: Thick walled appearance is likely from underdistention.  Reproductive: Left cornual sub serosal fibroid measuring 2 cm. There is a right adnexal cystic mass measuring 62 mm, increased from 55 mm in 2009. There could be a inferior septation within calcification. No discrete enhancing nodule is identified.  Bowel: There is may be a degree of rectal prolapse. Linear high density in the circumferentially thickened anus is likely combination of escaping oral contrast and hemorrhoids. Visual inspection could confirm. The colon is diffusely distended with fluid levels; no obstruction or gross mass lesion. Negative small bowel  Retroperitoneum: Diffuse retroperitoneal edema from generalized edematous state.  Peritoneum: Small ascites.  Vascular: No acute abnormality.  OSSEOUS: No acute abnormalities.  IMPRESSION: 1. Third spacing presumably from the patient's severe hypoproteinemia. 2. Chronic pancreatitis  which has developed from 2009. 3. Patchy airspace disease, likely pulmonary edema given interstitial thickening and #1, but atypical/opportunistic infection or inflammatory pneumonia could also give this appearance. 4. 6 cm slowly enlarging cystic mass in the right adnexa, primarily concerning for a benign epithelial neoplasm. This would not explain the patient's cachexia. 5. Nonspecific thickening of the anus, correlate with exam.   Electronically Signed   By: Marnee Spring M.D.   On: 03/30/2014 22:08   Ct Abdomen Pelvis W Contrast  03/30/2014   CLINICAL DATA:  Weight loss and pulmonary nodule.  EXAM: CT CHEST, ABDOMEN, AND PELVIS WITH CONTRAST  TECHNIQUE: Multidetector CT imaging of the chest, abdomen and pelvis was performed following the standard protocol during bolus administration of intravenous contrast.  CONTRAST:  80mL OMNIPAQUE IOHEXOL 300 MG/ML  SOLN  COMPARISON:  Abdominal CT 04/25/2007  FINDINGS: CT CHEST FINDINGS  THORACIC INLET/BODY WALL:  Severe cachexia with diffuse fat haziness.  No adenopathy  MEDIASTINUM:  No cardiomegaly. There is a small pericardial effusion. No acute vascular findings. There is atherosclerosis, best visualized on the descending aorta. Prominent mediastinal lymph nodes, but no definitive adenopathy.  LUNG WINDOWS:  Interlobular septal thickening at the bases with peripheral ground-glass density reaching to the apices. Small, simple layering pleural effusions. No suspicious nodule to explain the chest x-ray findings  OSSEOUS:  No acute fracture.  No suspicious lytic or blastic lesions.  CT ABDOMEN AND PELVIS FINDINGS  BODY WALL: Unremarkable.  Liver: Possible steatosis.  Biliary: Thick wall likely from contraction and edematous state.  Pancreas: Chronic pancreatitis has developed since 2009, with diffuse coarse calcifications and either cyst or focal ductal enlargement in the uncinate process. There is no evidence of enhancing mass, although limited by diffuse  retroperitoneal edema.  Spleen: Unremarkable.  Adrenals: Unremarkable.  Kidneys and ureters: No hydronephrosis or stone.  Bladder: Thick walled appearance is likely from underdistention.  Reproductive: Left cornual sub serosal fibroid measuring 2 cm. There is a right adnexal cystic mass measuring 62 mm, increased from 55 mm in 2009. There could be a inferior septation within calcification. No discrete enhancing nodule is identified.  Bowel: There is may be a degree of rectal prolapse. Linear high density in the circumferentially thickened anus is likely combination of escaping oral contrast and hemorrhoids. Visual inspection could confirm. The colon is diffusely distended with fluid levels; no obstruction or gross mass lesion. Negative small bowel  Retroperitoneum: Diffuse retroperitoneal edema from generalized edematous state.  Peritoneum: Small ascites.  Vascular: No acute abnormality.  OSSEOUS: No acute abnormalities.  IMPRESSION: 1. Third spacing presumably from the patient's severe hypoproteinemia. 2. Chronic pancreatitis which has developed from 2009. 3. Patchy airspace disease, likely pulmonary edema given interstitial thickening and #1, but atypical/opportunistic infection or inflammatory pneumonia could also give this appearance. 4. 6 cm slowly enlarging cystic mass in the right adnexa, primarily concerning for a benign epithelial neoplasm. This would not explain the patient's cachexia. 5. Nonspecific thickening of the anus, correlate with exam.   Electronically Signed   By: Marnee Spring M.D.   On: 03/30/2014 22:08    Scheduled Meds: . feeding supplement (ENSURE COMPLETE)  237 mL Oral BID BM  . folic acid  1 mg Oral Daily  . heparin  5,000 Units Subcutaneous 3 times per day  . levofloxacin (LEVAQUIN) IV  750 mg Intravenous Q48H  . metronidazole  500 mg Intravenous Q8H  . multivitamin with minerals  1 tablet Oral Daily  . thiamine  100 mg Oral Daily   Continuous Infusions: . 0.9 % NaCl with  KCl 40 mEq / L 100 mL/hr (03/31/14 0517)     Time spent: > 35 minutes    Penny Pia  [facility] Pager [phone] If 7PM-7AM, please contact night-coverage at [email or web address], password [facility] 03/31/2014, 1:43 PM  LOS: 0 days

## 2014-03-31 NOTE — Progress Notes (Signed)
UR completed 

## 2014-03-31 NOTE — Progress Notes (Signed)
Pt has stated that she still wishes to leave AMA. Paperwork signed, PIV d/c. Dr aware.

## 2014-03-31 NOTE — ED Notes (Signed)
Patient transported to CT 

## 2014-03-31 NOTE — ED Notes (Signed)
Lab called with a critical value for this patient, I called and talked with Daniella the nurse and advised her to call the admitting doctor and let them know because the patient was already on the way upstairs.

## 2014-03-31 NOTE — Clinical Social Work Note (Signed)
CSW Consult Acknowledged:   CSW received a consult for medication assistance. CSW informed case Production designer, theatre/television/filmmanager.  CSW will sign off.   Supriya Beaston, MSW, LCSWA [phone]

## 2014-04-01 LAB — AFP TUMOR MARKER: AFP TUMOR MARKER: 2.6 ng/mL (ref 0.0–8.3)

## 2014-04-01 LAB — CANCER ANTIGEN 19-9: CA 19-9: 1 U/mL (ref 0–35)

## 2014-04-01 LAB — HIV ANTIBODY (ROUTINE TESTING W REFLEX): HIV SCREEN 4TH GENERATION: NONREACTIVE

## 2014-04-01 LAB — PREALBUMIN: Prealbumin: 6.4 mg/dL — ABNORMAL LOW (ref 17.0–34.0)

## 2014-04-01 LAB — CEA: CEA: 58.2 ng/mL — ABNORMAL HIGH (ref 0.0–4.7)

## 2014-04-01 LAB — CA 125: CA 125: 46.1 U/mL — ABNORMAL HIGH (ref 0.0–34.0)

## 2014-04-06 LAB — CULTURE, BLOOD (ROUTINE X 2)
CULTURE: NO GROWTH
Culture: NO GROWTH

## 2014-04-28 DEATH — deceased

## 2016-04-11 IMAGING — DX DG CHEST 2V
2 series · 2 of 2 positions shown · non-contrast
Comparison: August 23, 2013

CLINICAL DATA: Cough

EXAM:
CHEST  2 VIEW

[chest pa]
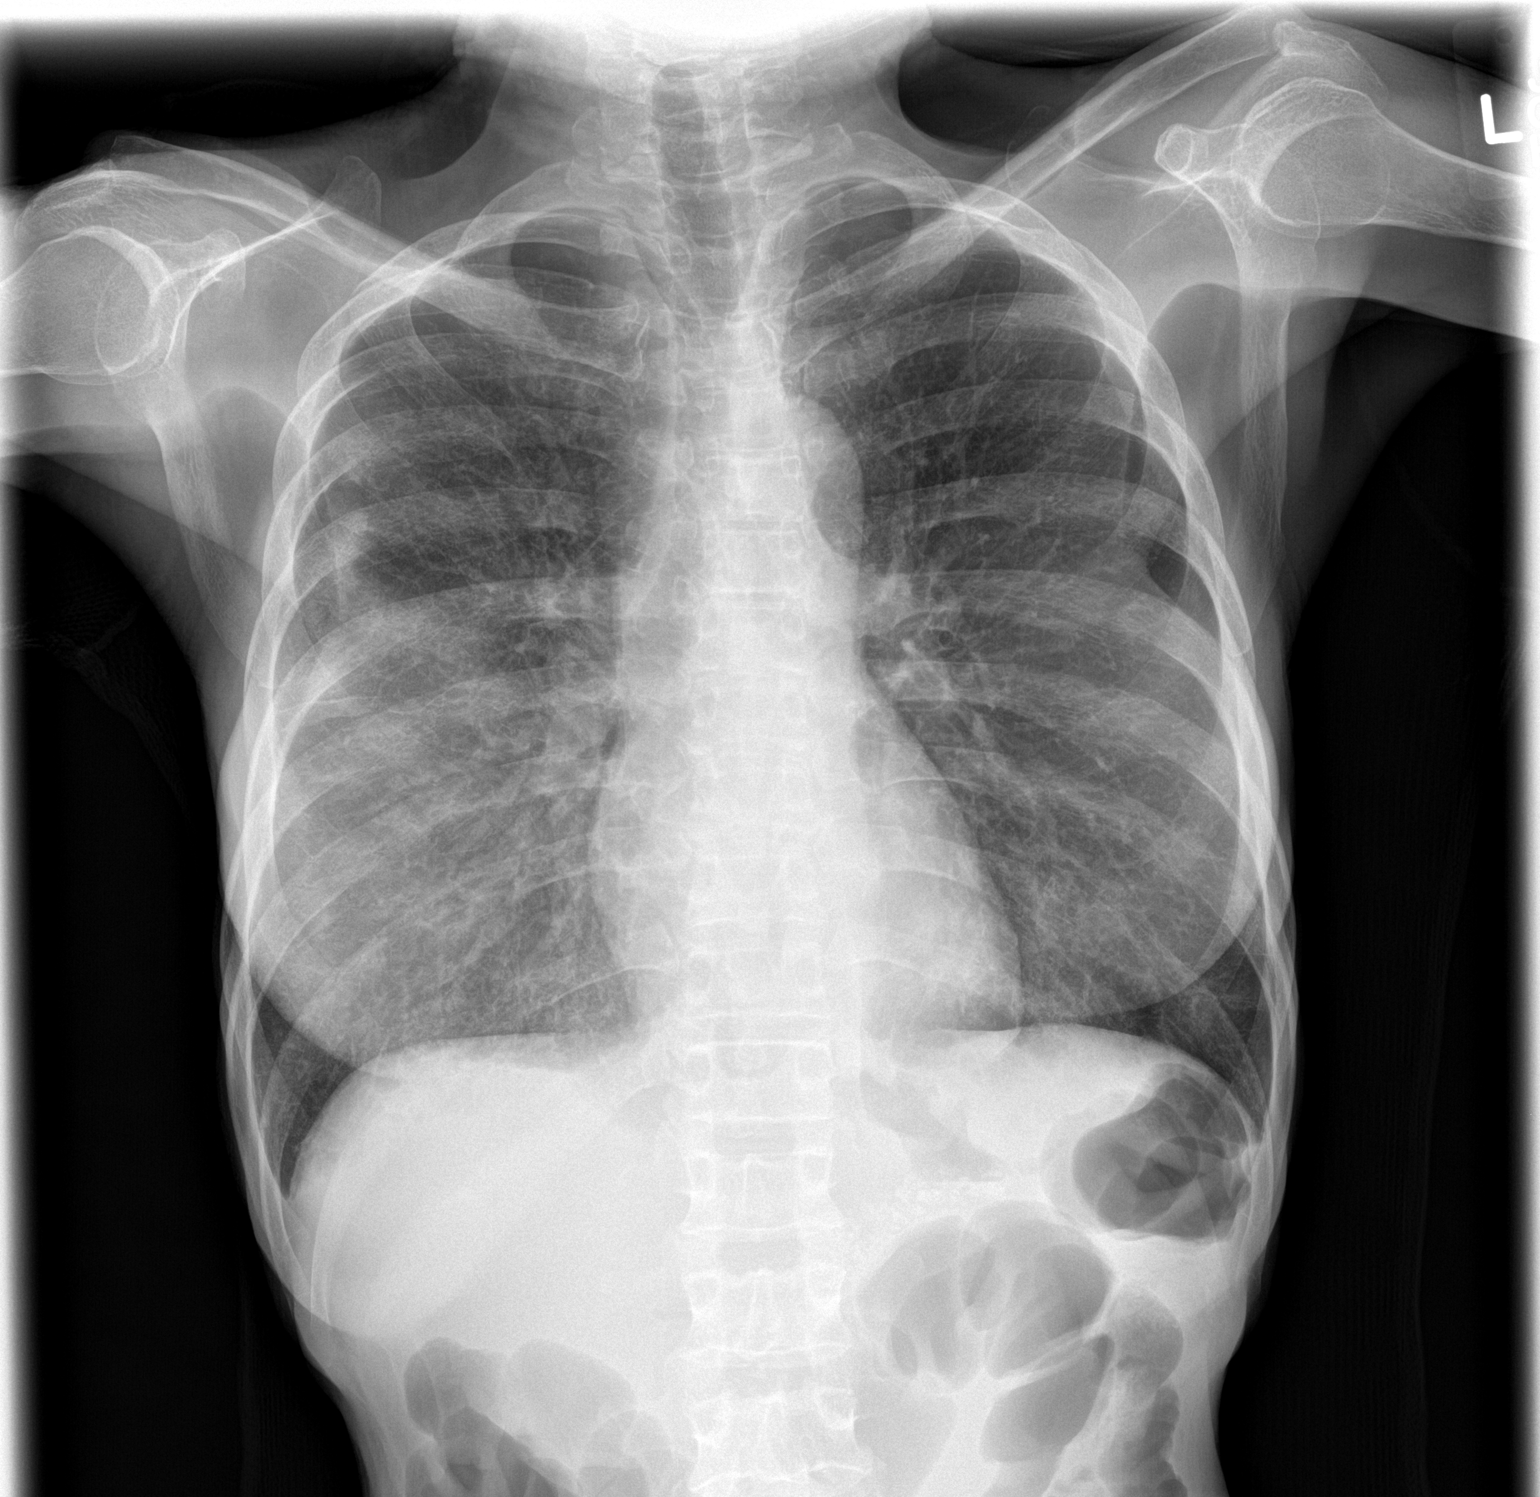

[chest lat]
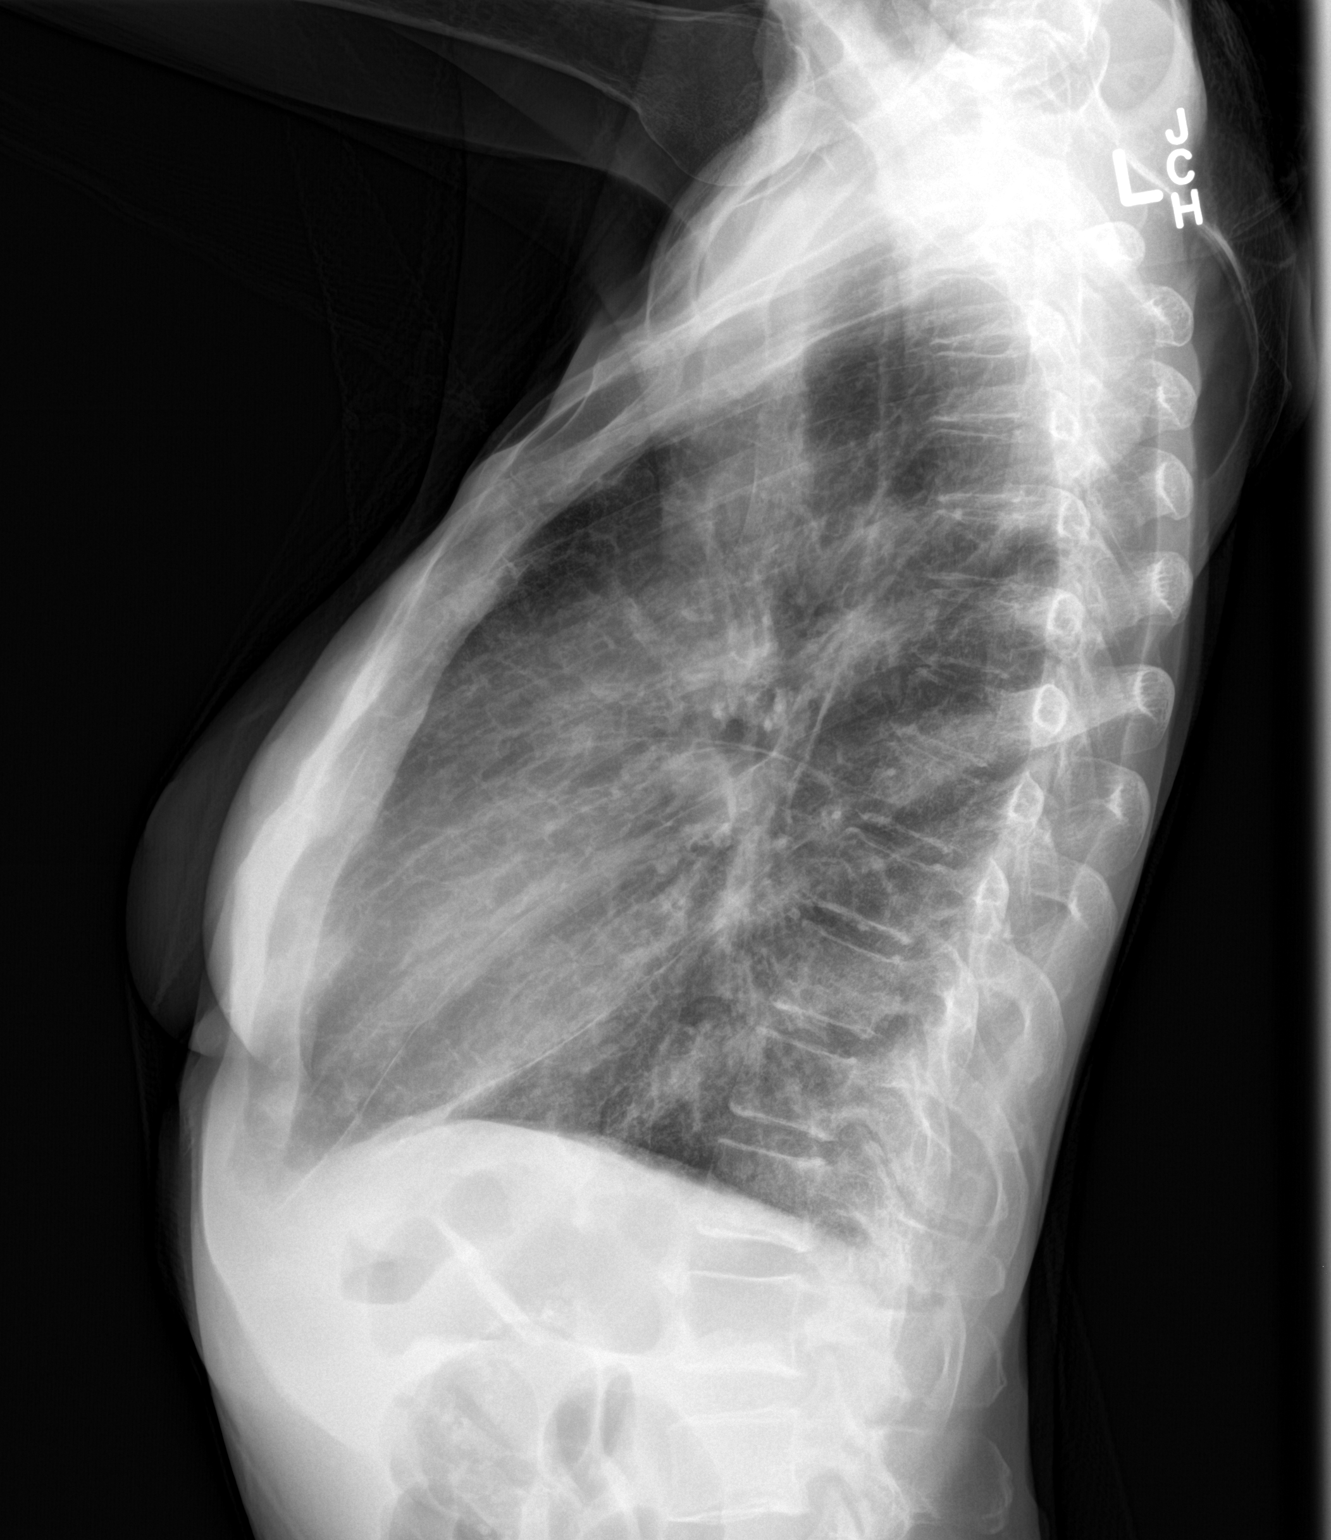

[2 of 2 positions shown; findings below may reference images not displayed]

FINDINGS: The interstitium is mildly prominent in a generalized manner. There
is no frank edema or consolidation. There is a 6 mm nodular opacity
in the right upper lobe, not present on prior study. Heart size and
pulmonary vascularity are normal. No adenopathy. There is a paucity
of body fat.
IMPRESSION: 6 mm nodular opacity right upper lobe, not present on prior study.
Given a change from the previous study, noncontrast enhanced chest
CT advised to further assess.

The interstitium is slightly prominent in a generalized manner.
Suspect underlying chronic inflammatory type change. There is no
frank edema or consolidation. No adenopathy.
